# Patient Record
Sex: Female | Born: 1935 | Race: White | Hispanic: No | Smoking: Never smoker
Health system: Southern US, Community
[De-identification: ages and names within clinical notes are randomized; demographics above are authoritative.]

## PROBLEM LIST (undated history)

## (undated) DIAGNOSIS — E079 Disorder of thyroid, unspecified: Secondary | ICD-10-CM

## (undated) DIAGNOSIS — I10 Essential (primary) hypertension: Secondary | ICD-10-CM

## (undated) DIAGNOSIS — I639 Cerebral infarction, unspecified: Secondary | ICD-10-CM

## (undated) DIAGNOSIS — E785 Hyperlipidemia, unspecified: Secondary | ICD-10-CM

## (undated) HISTORY — DX: Essential (primary) hypertension: I10

## (undated) HISTORY — DX: Hyperlipidemia, unspecified: E78.5

## (undated) HISTORY — PX: JOINT REPLACEMENT: SHX530

---

## 2013-08-20 ENCOUNTER — Ambulatory Visit: Payer: Medicare Other | Admitting: Family Medicine

## 2013-08-25 ENCOUNTER — Ambulatory Visit (INDEPENDENT_AMBULATORY_CARE_PROVIDER_SITE_OTHER): Payer: Medicare Other | Admitting: Family Medicine

## 2013-08-25 ENCOUNTER — Encounter: Payer: Self-pay | Admitting: Family Medicine

## 2013-08-25 ENCOUNTER — Ambulatory Visit: Payer: Self-pay | Admitting: Family Medicine

## 2013-08-25 VITALS — BP 195/91 | HR 62 | Ht 65.0 in | Wt 204.6 lb

## 2013-08-25 DIAGNOSIS — M5412 Radiculopathy, cervical region: Secondary | ICD-10-CM

## 2013-08-25 DIAGNOSIS — M5010 Cervical disc disorder with radiculopathy, unspecified cervical region: Secondary | ICD-10-CM

## 2013-08-25 NOTE — Patient Instructions (Signed)
You have cervical radiculopathy (an irritated nerve in the neck). Start physical therapy for stretching, exercises, traction, and modalities. Heat 15 minutes at a time 3-4 times a day to help with spasms. Watch head position when on computers, texting, when sleeping in bed - should in line with back to prevent further nerve traction and irritation. Consider prednisone 6 day dose pack to relieve irritation/inflammation of the nerve. Consider cervical collar if severely painful. Simple range of motion exercises within limits of pain to prevent further stiffness. Consider home traction unit if you get benefit with this in physical therapy. If not improving we will consider an MRI. Follow up with me in 5-6 weeks.

## 2013-08-27 ENCOUNTER — Encounter: Payer: Self-pay | Admitting: Family Medicine

## 2013-08-27 DIAGNOSIS — M5010 Cervical disc disorder with radiculopathy, unspecified cervical region: Secondary | ICD-10-CM | POA: Insufficient documentation

## 2013-08-27 NOTE — Assessment & Plan Note (Signed)
discussed options.  Patient would like to start with physical therapy.  Heat, ergonomic issues discussed.  Consider prednisone, cervical collar, imaging if not improving. F/u in 5-6 weeks.

## 2013-08-27 NOTE — Progress Notes (Signed)
Patient ID: Teresa Ward, female   DOB: 1930/09/22, 80 y.o.   MRN: [id number]  PCP: No primary provider on file.  Subjective:   HPI: Patient is a 80 y.o. female here for left arm numbness.  Patient reports for several months she has had left shoulder soreness initially that improved. Then became sore in left upper arm to neck. Associated with numbness down arm into all fingers and thumb. Has not tried anything for this. Nothing similar in the past. No bowel/bladder dysfunction.  Past Medical History  Diagnosis Date  . Hyperlipidemia   . Hypertension     No current outpatient prescriptions on file prior to visit.   No current facility-administered medications on file prior to visit.    History reviewed. No pertinent past surgical history.  No Known Allergies  History   Social History  . Marital Status: Married    Spouse Name: N/A    Number of Children: N/A  . Years of Education: N/A   Occupational History  . Not on file.   Social History Main Topics  . Smoking status: Never Smoker   . Smokeless tobacco: Not on file  . Alcohol Use: Not on file  . Drug Use: Not on file  . Sexual Activity: Not on file   Other Topics Concern  . Not on file   Social History Narrative  . No narrative on file    Family History  Problem Relation Age of Onset  . Diabetes Father     BP 195/91  Pulse 62  Ht 5\' 5"  (1.651 m)  Wt 204 lb 9.6 oz (92.806 kg)  BMI 34.05 kg/m2  Review of Systems: See HPI above.    Objective:  Physical Exam:  Gen: NAD  Neck: No gross deformity, swelling, bruising. TTP left cervical paraspinal region.  No midline/bony TTP. FROM neck - pain with full extension, flexion, left lat rotation mildly. BUE strength 5/5.   Sensation intact to light touch currently 2+ equal reflexes in triceps, biceps, brachioradialis tendons. Negative spurlings. NV intact distal BUEs.  L shoulder: No swelling, ecchymoses.  No gross deformity. No  TTP. FROM. Negative Hawkins, Neers. Strength 5/5 with empty can and resisted internal/external rotation. Negative apprehension. NV intact distally.    Assessment & Plan:  1. Cervical radiculopathy - discussed options.  Patient would like to start with physical therapy.  Heat, ergonomic issues discussed.  Consider prednisone, cervical collar, imaging if not improving. F/u in 5-6 weeks.

## 2013-09-01 ENCOUNTER — Ambulatory Visit: Payer: Medicare Other | Attending: Family Medicine | Admitting: Rehabilitation

## 2013-09-01 DIAGNOSIS — M5412 Radiculopathy, cervical region: Secondary | ICD-10-CM | POA: Insufficient documentation

## 2013-09-01 DIAGNOSIS — IMO0001 Reserved for inherently not codable concepts without codable children: Secondary | ICD-10-CM | POA: Insufficient documentation

## 2013-09-03 ENCOUNTER — Encounter: Payer: Medicare Other | Admitting: Rehabilitation

## 2013-09-08 ENCOUNTER — Ambulatory Visit: Payer: Medicare Other | Attending: Family Medicine | Admitting: Rehabilitation

## 2013-09-08 DIAGNOSIS — M5412 Radiculopathy, cervical region: Secondary | ICD-10-CM | POA: Insufficient documentation

## 2013-09-08 DIAGNOSIS — IMO0001 Reserved for inherently not codable concepts without codable children: Secondary | ICD-10-CM | POA: Insufficient documentation

## 2013-09-10 ENCOUNTER — Ambulatory Visit: Payer: Medicare Other | Admitting: Rehabilitation

## 2013-09-15 ENCOUNTER — Ambulatory Visit: Payer: Medicare Other | Admitting: Rehabilitation

## 2013-09-17 ENCOUNTER — Ambulatory Visit: Payer: Medicare Other | Admitting: Rehabilitation

## 2013-09-22 ENCOUNTER — Ambulatory Visit: Payer: Medicare Other | Admitting: Rehabilitation

## 2013-09-24 ENCOUNTER — Ambulatory Visit: Payer: Medicare Other | Admitting: Rehabilitation

## 2013-10-06 ENCOUNTER — Ambulatory Visit (INDEPENDENT_AMBULATORY_CARE_PROVIDER_SITE_OTHER): Payer: Medicare Other | Admitting: Family Medicine

## 2013-10-06 ENCOUNTER — Encounter: Payer: Self-pay | Admitting: Family Medicine

## 2013-10-06 VITALS — BP 178/107 | HR 65 | Ht 65.0 in | Wt 203.6 lb

## 2013-10-06 DIAGNOSIS — M5010 Cervical disc disorder with radiculopathy, unspecified cervical region: Secondary | ICD-10-CM

## 2013-10-06 DIAGNOSIS — M5412 Radiculopathy, cervical region: Secondary | ICD-10-CM

## 2013-10-06 NOTE — Progress Notes (Signed)
Patient ID: Teresa Ward, female   DOB: August 05, 1930, 80 y.o.   MRN: [id number]  PCP: No primary provider on file.  Subjective:   HPI: Patient is a 80 y.o. female here for left arm numbness.  2/16: Patient reports for several months she has had left shoulder soreness initially that improved. Then became sore in left upper arm to neck. Associated with numbness down arm into all fingers and thumb. Has not tried anything for this. Nothing similar in the past. No bowel/bladder dysfunction.  3/30: Patient reports she feels about the same as last visit but then notes no longer with shoulder, neck soreness. Does have numbness in left hand thumb through middle finger. Did physical therapy but stopped because didn't notice benefit with this. Not taking any medications at this point. No bowel/bladder dysfunction.  Past Medical History  Diagnosis Date  . Hyperlipidemia   . Hypertension     No current outpatient prescriptions on file prior to visit.   No current facility-administered medications on file prior to visit.    History reviewed. No pertinent past surgical history.  No Known Allergies  History   Social History  . Marital Status: Married    Spouse Name: N/A    Number of Children: N/A  . Years of Education: N/A   Occupational History  . Not on file.   Social History Main Topics  . Smoking status: Never Smoker   . Smokeless tobacco: Not on file  . Alcohol Use: Not on file  . Drug Use: Not on file  . Sexual Activity: Not on file   Other Topics Concern  . Not on file   Social History Narrative  . No narrative on file    Family History  Problem Relation Age of Onset  . Diabetes Father     BP 178/107  Pulse 65  Ht 5\' 5"  (1.651 m)  Wt 203 lb 9.6 oz (92.352 kg)  BMI 33.88 kg/m2  Review of Systems: See HPI above.    Objective:  Physical Exam:  Gen: NAD  Neck: No gross deformity, swelling, bruising. No longer with TTP left cervical paraspinal  region.  No midline/bony TTP. FROM neck without pain. BUE strength 5/5.   Sensation intact to light touch currently 2+ equal reflexes in triceps, biceps, brachioradialis tendons. Negative spurlings. NV intact distal BUEs. Negative tinels, phalens of carpal tunnel.    Assessment & Plan:  1. Cervical radiculopathy - has improved from last visit but still left with numbness.  This is also in carpal tunnel distribution though that testing is negative.  She would like to try physical therapy location that her daughter goes to - will write for this.  Try cockup wrist brace at bedtime.  Consider prednisone, cervical collar, imaging, nortriptyline or neurontin if not improving. F/u in 6 weeks.

## 2013-10-06 NOTE — Patient Instructions (Signed)
You have cervical radiculopathy (an irritated nerve in the neck). It's possible you have carpal tunnel syndrome though testing is negative for this - treatment is simple - use a cockup wrist brace when you sleep for 6 weeks. We will try the physical therapy place you mentioned. Heat 15 minutes at a time 3-4 times a day to help with spasms. Watch head position when on computers, texting, when sleeping in bed - should in line with back to prevent further nerve traction and irritation. Consider prednisone, cervical collar, other nerve blocking medicines if not improving. Follow up in 6 weeks.

## 2013-10-06 NOTE — Assessment & Plan Note (Signed)
has improved from last visit but still left with numbness.  This is also in carpal tunnel distribution though that testing is negative.  She would like to try physical therapy location that her daughter goes to - will write for this.  Try cockup wrist brace at bedtime.  Consider prednisone, cervical collar, imaging, nortriptyline or neurontin if not improving. F/u in 6 weeks.

## 2013-11-17 ENCOUNTER — Ambulatory Visit: Payer: Medicare Other | Admitting: Family Medicine

## 2015-09-02 DIAGNOSIS — I10 Essential (primary) hypertension: Secondary | ICD-10-CM | POA: Insufficient documentation

## 2015-09-02 DIAGNOSIS — E039 Hypothyroidism, unspecified: Secondary | ICD-10-CM | POA: Insufficient documentation

## 2017-02-14 DIAGNOSIS — I639 Cerebral infarction, unspecified: Secondary | ICD-10-CM | POA: Insufficient documentation

## 2017-02-22 DIAGNOSIS — R55 Syncope and collapse: Secondary | ICD-10-CM | POA: Insufficient documentation

## 2017-02-22 DIAGNOSIS — Z789 Other specified health status: Secondary | ICD-10-CM | POA: Insufficient documentation

## 2017-02-22 DIAGNOSIS — Z7409 Other reduced mobility: Secondary | ICD-10-CM | POA: Insufficient documentation

## 2017-03-05 DIAGNOSIS — R1312 Dysphagia, oropharyngeal phase: Secondary | ICD-10-CM | POA: Insufficient documentation

## 2017-03-05 DIAGNOSIS — E119 Type 2 diabetes mellitus without complications: Secondary | ICD-10-CM | POA: Insufficient documentation

## 2017-03-05 DIAGNOSIS — D638 Anemia in other chronic diseases classified elsewhere: Secondary | ICD-10-CM | POA: Insufficient documentation

## 2017-03-05 DIAGNOSIS — I6990 Unspecified sequelae of unspecified cerebrovascular disease: Secondary | ICD-10-CM | POA: Insufficient documentation

## 2018-01-01 ENCOUNTER — Ambulatory Visit (HOSPITAL_BASED_OUTPATIENT_CLINIC_OR_DEPARTMENT_OTHER)
Admission: RE | Admit: 2018-01-01 | Discharge: 2018-01-01 | Disposition: A | Discharge status: Home / Self Care | Payer: Medicare HMO | Source: Ambulatory Visit | Attending: Family Medicine | Admitting: Family Medicine

## 2018-01-01 ENCOUNTER — Encounter: Payer: Self-pay | Admitting: Family Medicine

## 2018-01-01 ENCOUNTER — Ambulatory Visit: Payer: Medicare HMO | Admitting: Family Medicine

## 2018-01-01 VITALS — BP 121/79 | HR 66 | Ht 65.0 in | Wt 158.0 lb

## 2018-01-01 DIAGNOSIS — S52592A Other fractures of lower end of left radius, initial encounter for closed fracture: Secondary | ICD-10-CM | POA: Insufficient documentation

## 2018-01-01 DIAGNOSIS — M7989 Other specified soft tissue disorders: Secondary | ICD-10-CM | POA: Diagnosis present

## 2018-01-01 DIAGNOSIS — S6992XA Unspecified injury of left wrist, hand and finger(s), initial encounter: Secondary | ICD-10-CM

## 2018-01-01 NOTE — Patient Instructions (Signed)
You have a distal radius fracture that has some healing already suggesting this is about 183 weeks old. Wear the wrist brace at all times except to wash the area, ice this. Ice 15 minutes at a time as needed. Ok to take tylenol 500mg  1-2 tabs three times a day as needed for pain. Elevate above your heart to help with the swelling. Follow up with me in 2.5-3 weeks to reevaluate, potentially repeat x-rays.

## 2018-01-02 ENCOUNTER — Encounter: Payer: Self-pay | Admitting: Family Medicine

## 2018-01-02 DIAGNOSIS — S6992XD Unspecified injury of left wrist, hand and finger(s), subsequent encounter: Secondary | ICD-10-CM | POA: Insufficient documentation

## 2018-01-02 NOTE — Assessment & Plan Note (Signed)
independently reviewed radiographs and noted mildly dorsally angulated and impacted distal radius fracture with healing already present.  This suggests that the fracture has been sustained more remotely than 10 days ago as patient thought.  We discussed given that this is remote and already with some interval healing the best course of action is to complete conservative treatment for this.  She was advised that she is likely to have decreased motion at the wrist especially with extension but her pain should resolve and she should regain her strength.  Balance is an issue for her as well.  We discussed all options of immobilization and opted for a cock up wrist brace to wear at all times except to wash and ice the area.  She will take Tylenol as needed for pain, elevate above her heart to help with the swelling.  She will follow-up in 2-1/2 to 3 weeks to reevaluate and possibly repeat her x-rays.  She may need occupational therapy when she is completed treatment.

## 2018-01-02 NOTE — Progress Notes (Signed)
PCP: System, Pcp Not In  Subjective:   HPI: Patient is a 84 y.o. female here for left wrist injury.  Patient is here with her daughter. They report that approximately 1-1/2 weeks ago she lost her balance at home and fell sustaining a FOOSH injury to her left wrist. Since then she is had 7 out of 10 level of pain on the dorsal aspect of her left hand and wrist. No prior injuries to this. Associated swelling and difficulty holding gripping items including a coffee cup. Some mild bruising but no other skin changes. No numbness.  Past Medical History:  Diagnosis Date  . Hyperlipidemia   . Hypertension     Current Outpatient Medications on File Prior to Visit  Medication Sig Dispense Refill  . aspirin 325 MG tablet Take by mouth.    Marland Kitchen atorvastatin (LIPITOR) 80 MG tablet Take 80 mg by mouth daily.  2  . DULoxetine (CYMBALTA) 60 MG capsule Take 60 mg by mouth at bedtime.  4  . levothyroxine (SYNTHROID, LEVOTHROID) 75 MCG tablet Take 75 mcg by mouth every morning.  3  . oxybutynin (DITROPAN-XL) 5 MG 24 hr tablet     . valsartan-hydrochlorothiazide (DIOVAN-HCT) 320-12.5 MG tablet Take 1 tablet by mouth every morning.  3   No current facility-administered medications on file prior to visit.     History reviewed. No pertinent surgical history.  No Known Allergies  Social History   Socioeconomic History  . Marital status: Married    Spouse name: Not on file  . Number of children: Not on file  . Years of education: Not on file  . Highest education level: Not on file  Occupational History  . Not on file  Social Needs  . Financial resource strain: Not on file  . Food insecurity:    Worry: Not on file    Inability: Not on file  . Transportation needs:    Medical: Not on file    Non-medical: Not on file  Tobacco Use  . Smoking status: Never Smoker  . Smokeless tobacco: Never Used  Substance and Sexual Activity  . Alcohol use: Not on file  . Drug use: Not on file  . Sexual  activity: Not on file  Lifestyle  . Physical activity:    Days per week: Not on file    Minutes per session: Not on file  . Stress: Not on file  Relationships  . Social connections:    Talks on phone: Not on file    Gets together: Not on file    Attends religious service: Not on file    Active member of club or organization: Not on file    Attends meetings of clubs or organizations: Not on file    Relationship status: Not on file  . Intimate partner violence:    Fear of current or ex partner: Not on file    Emotionally abused: Not on file    Physically abused: Not on file    Forced sexual activity: Not on file  Other Topics Concern  . Not on file  Social History Narrative  . Not on file    Family History  Problem Relation Age of Onset  . Diabetes Father     BP 121/79   Pulse 66   Ht 5\' 5"  (1.651 m)   Wt 158 lb (71.7 kg)   BMI 26.29 kg/m   Review of Systems: See HPI above.     Objective:  Physical Exam:  Gen: NAD,  comfortable in exam room  Left wrist: Swelling over radial aspect of hand and wrist.  Slight bruising in this area.  No malrotation or angulation of digits or distal forearm. There is tenderness to palpation over lunate, distal radius and snuffbox.  No other tenderness including elbow. Full range of motion digits with 5 out of 5 strength with finger extension, abduction, thumb opposition.  Only has about 5 degrees of extension and 10 degrees of flexion. Sensation intact light touch with 2+ radial pulses.  Right wrist: No deformity. FROM with 5/5 strength. No tenderness to palpation. NVI distally.   Assessment & Plan:  1. Left wrist injury -independently reviewed radiographs and noted mildly dorsally angulated and impacted distal radius fracture with healing already present.  This suggests that the fracture has been sustained more remotely than 10 days ago as patient thought.  We discussed given that this is remote and already with some interval  healing the best course of action is to complete conservative treatment for this.  She was advised that she is likely to have decreased motion at the wrist especially with extension but her pain should resolve and she should regain her strength.  Balance is an issue for her as well.  We discussed all options of immobilization and opted for a cock up wrist brace to wear at all times except to wash and ice the area.  She will take Tylenol as needed for pain, elevate above her heart to help with the swelling.  She will follow-up in 2-1/2 to 3 weeks to reevaluate and possibly repeat her x-rays.  She may need occupational therapy when she is completed treatment.

## 2018-01-07 DIAGNOSIS — G8929 Other chronic pain: Secondary | ICD-10-CM | POA: Insufficient documentation

## 2018-01-07 DIAGNOSIS — F419 Anxiety disorder, unspecified: Secondary | ICD-10-CM | POA: Insufficient documentation

## 2018-01-07 DIAGNOSIS — N3281 Overactive bladder: Secondary | ICD-10-CM | POA: Insufficient documentation

## 2018-01-24 ENCOUNTER — Ambulatory Visit: Payer: Medicare HMO | Admitting: Family Medicine

## 2018-01-24 ENCOUNTER — Encounter: Payer: Self-pay | Admitting: Family Medicine

## 2018-01-24 ENCOUNTER — Ambulatory Visit (HOSPITAL_BASED_OUTPATIENT_CLINIC_OR_DEPARTMENT_OTHER)
Admission: RE | Admit: 2018-01-24 | Discharge: 2018-01-24 | Disposition: A | Discharge status: Home / Self Care | Payer: Medicare HMO | Source: Ambulatory Visit | Attending: Family Medicine | Admitting: Family Medicine

## 2018-01-24 VITALS — BP 154/82 | HR 61 | Ht 66.0 in | Wt 176.0 lb

## 2018-01-24 DIAGNOSIS — X58XXXD Exposure to other specified factors, subsequent encounter: Secondary | ICD-10-CM | POA: Diagnosis not present

## 2018-01-24 DIAGNOSIS — S52502D Unspecified fracture of the lower end of left radius, subsequent encounter for closed fracture with routine healing: Secondary | ICD-10-CM | POA: Diagnosis present

## 2018-01-24 DIAGNOSIS — S59202D Unspecified physeal fracture of lower end of radius, left arm, subsequent encounter for fracture with routine healing: Secondary | ICD-10-CM | POA: Diagnosis not present

## 2018-01-24 DIAGNOSIS — S6992XD Unspecified injury of left wrist, hand and finger(s), subsequent encounter: Secondary | ICD-10-CM

## 2018-01-24 NOTE — Patient Instructions (Signed)
Your x-rays look good but this is still not healed enough to stop the brace. Continue the brace for 2 more weeks and follow up with me at that time.

## 2018-01-25 ENCOUNTER — Encounter: Payer: Self-pay | Admitting: Family Medicine

## 2018-01-25 NOTE — Progress Notes (Signed)
PCP: System, Pcp Not In  Subjective:   HPI: Patient is a 84 y.o. female here for left wrist injury.  6/25: Patient is here with her daughter. They report that approximately 1-1/2 weeks ago she lost her balance at home and fell sustaining a FOOSH injury to her left wrist. Since then she is had 7 out of 10 level of pain on the dorsal aspect of her left hand and wrist. No prior injuries to this. Associated swelling and difficulty holding gripping items including a coffee cup. Some mild bruising but no other skin changes. No numbness.  7/18: Patient returns stating she's doing well. Tolerating wrist brace. Pain with movement if not wearing this. Pain currently 0/10, occasionally taking tylenol. No skin changes.  Past Medical History:  Diagnosis Date  . Hyperlipidemia   . Hypertension     Current Outpatient Medications on File Prior to Visit  Medication Sig Dispense Refill  . mirabegron ER (MYRBETRIQ) 50 MG TB24 tablet Take by mouth.    Marland Kitchen aspirin 325 MG tablet Take by mouth.    Marland Kitchen atorvastatin (LIPITOR) 80 MG tablet Take 80 mg by mouth daily.  2  . DULoxetine (CYMBALTA) 60 MG capsule Take 60 mg by mouth at bedtime.  4  . levothyroxine (SYNTHROID, LEVOTHROID) 100 MCG tablet Take 100 mcg by mouth daily.  0  . oxybutynin (DITROPAN-XL) 5 MG 24 hr tablet     . valsartan-hydrochlorothiazide (DIOVAN-HCT) 320-12.5 MG tablet Take 1 tablet by mouth every morning.  3   No current facility-administered medications on file prior to visit.     History reviewed. No pertinent surgical history.  No Known Allergies  Social History   Socioeconomic History  . Marital status: Married    Spouse name: Not on file  . Number of children: Not on file  . Years of education: Not on file  . Highest education level: Not on file  Occupational History  . Not on file  Social Needs  . Financial resource strain: Not on file  . Food insecurity:    Worry: Not on file    Inability: Not on file  .  Transportation needs:    Medical: Not on file    Non-medical: Not on file  Tobacco Use  . Smoking status: Never Smoker  . Smokeless tobacco: Never Used  Substance and Sexual Activity  . Alcohol use: Not on file  . Drug use: Not on file  . Sexual activity: Not on file  Lifestyle  . Physical activity:    Days per week: Not on file    Minutes per session: Not on file  . Stress: Not on file  Relationships  . Social connections:    Talks on phone: Not on file    Gets together: Not on file    Attends religious service: Not on file    Active member of club or organization: Not on file    Attends meetings of clubs or organizations: Not on file    Relationship status: Not on file  . Intimate partner violence:    Fear of current or ex partner: Not on file    Emotionally abused: Not on file    Physically abused: Not on file    Forced sexual activity: Not on file  Other Topics Concern  . Not on file  Social History Narrative  . Not on file    Family History  Problem Relation Age of Onset  . Diabetes Father     BP (!) 154/82  Pulse 61   Ht 5\' 6"  (1.676 m)   Wt 176 lb (79.8 kg)   BMI 28.41 kg/m   Review of Systems: See HPI above.     Objective:  Physical Exam:  Gen: NAD, comfortable in exam room  Left wrist: Mild swelling distal wrist.  No bruising, malrotation or angulation. Minimal TTP distal radius.  No other tenderness. FROM digits with 5/5 strength finger extension, abduction, thumb opposition. Only 5 degrees extension, 10 degrees flexion of wrist. NVI distally.   Assessment & Plan:  1. Left wrist injury - independently reviewed repeat radiographs and healing noted but fracture line still visible of impacted distal radius fracture.  Advised we continue with wrist brace for 2 more weeks then reevaluate.  Tylenol if needed.

## 2018-01-25 NOTE — Assessment & Plan Note (Signed)
independently reviewed repeat radiographs and healing noted but fracture line still visible of impacted distal radius fracture.  Advised we continue with wrist brace for 2 more weeks then reevaluate.  Tylenol if needed.

## 2018-02-07 ENCOUNTER — Ambulatory Visit: Payer: Self-pay | Admitting: Family Medicine

## 2018-02-10 MED ORDER — HYDROCHLOROTHIAZIDE 25 MG PO TABS
25.00 | ORAL_TABLET | ORAL | Status: DC
Start: 2018-02-11 — End: 2018-02-10

## 2018-02-10 MED ORDER — DULOXETINE HCL 30 MG PO CPEP
60.00 | ORAL_CAPSULE | ORAL | Status: DC
Start: 2018-02-10 — End: 2018-02-10

## 2018-02-10 MED ORDER — LEVOTHYROXINE SODIUM 100 MCG PO TABS
100.00 | ORAL_TABLET | ORAL | Status: DC
Start: 2018-02-11 — End: 2018-02-10

## 2018-02-10 MED ORDER — LOSARTAN POTASSIUM 50 MG PO TABS
100.00 | ORAL_TABLET | ORAL | Status: DC
Start: 2018-02-11 — End: 2018-02-10

## 2018-02-10 MED ORDER — MIRABEGRON ER 50 MG PO TB24
50.00 | ORAL_TABLET | ORAL | Status: DC
Start: 2018-02-10 — End: 2018-02-10

## 2018-02-10 MED ORDER — ONDANSETRON HCL 4 MG/2ML IJ SOLN
4.00 | INTRAMUSCULAR | Status: DC
Start: ? — End: 2018-02-10

## 2018-02-10 MED ORDER — ATORVASTATIN CALCIUM 40 MG PO TABS
80.00 | ORAL_TABLET | ORAL | Status: DC
Start: 2018-02-10 — End: 2018-02-10

## 2018-02-10 MED ORDER — ACETAMINOPHEN 325 MG PO TABS
650.00 | ORAL_TABLET | ORAL | Status: DC
Start: ? — End: 2018-02-10

## 2018-02-14 ENCOUNTER — Ambulatory Visit: Payer: Medicare HMO | Admitting: Family Medicine

## 2018-02-14 ENCOUNTER — Encounter: Payer: Self-pay | Admitting: Family Medicine

## 2018-02-14 VITALS — BP 122/79 | HR 85 | Ht 64.0 in | Wt 178.0 lb

## 2018-02-14 DIAGNOSIS — S6992XD Unspecified injury of left wrist, hand and finger(s), subsequent encounter: Secondary | ICD-10-CM | POA: Diagnosis not present

## 2018-02-14 NOTE — Progress Notes (Signed)
PCP: System, Pcp Not In  Subjective:   HPI: Patient is a 84 y.o. female here for left wrist injury.  6/25: Patient is here with her daughter. They report that approximately 1-1/2 weeks ago she lost her balance at home and fell sustaining a FOOSH injury to her left wrist. Since then she is had 7 out of 10 level of pain on the dorsal aspect of her left hand and wrist. No prior injuries to this. Associated swelling and difficulty holding gripping items including a coffee cup. Some mild bruising but no other skin changes. No numbness.  7/18: Patient returns stating she's doing well. Tolerating wrist brace. Pain with movement if not wearing this. Pain currently 0/10, occasionally taking tylenol. No skin changes.  8/8: Patient reports her wrist feels about the same. Pain level 0/10 but up to 9/10 and sharp with motion. Wearing wrist brace regularly. No skin changes. She unfortunately had a fall last Thursday and was hospitalized with a subdural hematoma, spinous process fracture. She is being seen in follow-up for these by other physicians, wearing cervical collar.  Past Medical History:  Diagnosis Date  . Hyperlipidemia   . Hypertension     Current Outpatient Medications on File Prior to Visit  Medication Sig Dispense Refill  . aspirin 325 MG tablet Take by mouth.    Marland Kitchen atorvastatin (LIPITOR) 80 MG tablet Take 80 mg by mouth daily.  2  . DULoxetine (CYMBALTA) 60 MG capsule Take 60 mg by mouth at bedtime.  4  . levothyroxine (SYNTHROID, LEVOTHROID) 100 MCG tablet Take 100 mcg by mouth daily.  0  . levothyroxine (SYNTHROID, LEVOTHROID) 75 MCG tablet     . mirabegron ER (MYRBETRIQ) 50 MG TB24 tablet Take by mouth.    . oxybutynin (DITROPAN-XL) 5 MG 24 hr tablet     . valsartan-hydrochlorothiazide (DIOVAN-HCT) 320-12.5 MG tablet Take 1 tablet by mouth every morning.  3   No current facility-administered medications on file prior to visit.     History reviewed. No pertinent  surgical history.  No Known Allergies  Social History   Socioeconomic History  . Marital status: Married    Spouse name: Not on file  . Number of children: Not on file  . Years of education: Not on file  . Highest education level: Not on file  Occupational History  . Not on file  Social Needs  . Financial resource strain: Not on file  . Food insecurity:    Worry: Not on file    Inability: Not on file  . Transportation needs:    Medical: Not on file    Non-medical: Not on file  Tobacco Use  . Smoking status: Never Smoker  . Smokeless tobacco: Never Used  Substance and Sexual Activity  . Alcohol use: Not on file  . Drug use: Not on file  . Sexual activity: Not on file  Lifestyle  . Physical activity:    Days per week: Not on file    Minutes per session: Not on file  . Stress: Not on file  Relationships  . Social connections:    Talks on phone: Not on file    Gets together: Not on file    Attends religious service: Not on file    Active member of club or organization: Not on file    Attends meetings of clubs or organizations: Not on file    Relationship status: Not on file  . Intimate partner violence:    Fear of current or  ex partner: Not on file    Emotionally abused: Not on file    Physically abused: Not on file    Forced sexual activity: Not on file  Other Topics Concern  . Not on file  Social History Narrative  . Not on file    Family History  Problem Relation Age of Onset  . Diabetes Father     BP 122/79   Pulse 85   Ht 5\' 4"  (1.626 m)   Wt 178 lb (80.7 kg)   BMI 30.55 kg/m   Review of Systems: See HPI above.     Objective:  Physical Exam:  Gen: NAD, comfortable in exam room  Left wrist: Mild swelling circumferentially.  No bruising, malrotation or angulation. FROM digits with 5/5 strength.  Minimal motion of wrist - 5 degrees extension, 10 degrees flexion. No tenderness to palpation of distal radius or ulna. NVI distally.  MSK u/s  left wrist:  Near complete healing of distal radius fracture - small amount of fluid with neovascularity with small fracture line evident dorsally but good callus elsewhere.   Assessment & Plan:  1. Left wrist injury - ultrasound reassuring.  Last set of radiographs noted healing impacted distal radius fracture as well.  No tenderness at fracture site.  Pain consistent with stiffness from her immobility since being in brace - advised to wean out of this.  Icing, tylenol, topical medications if needed.  Call us if struggling and we can put in an order form home OT.  Daughter also asked about patient's right knee which intermittently hurts anteriorly - noted moderate spurring medially on ultrasound.  Reviewed arthritis instructions.

## 2018-02-14 NOTE — Patient Instructions (Signed)
Transition from using the brace now (it's ok if you need this for a little bit but I don't want you relying on this). Your pain is primarily from the stiffness having had to use the brace for so long. Icing, tylenol, topical capsaicin/biofreeze/aspercreme if needed. If you're struggling I can order  Occupational therapy to come to the house.  Your knee pain is due to arthritis. These are the different medications you can take for this: Tylenol 500mg  1-2 tabs three times a day for pain. Capsaicin, aspercreme, or biofreeze topically up to four times a day may also help with pain. Some supplements that may help for arthritis: Boswellia extract, curcumin, pycnogenol Aleve 1-2 tabs twice a day with food rarely as this can irritate your stomach, increase your blood pressure. Cortisone injections are an option. If cortisone injections do not help, there are different types of shots that may help but they take longer to take effect. It's important that you continue to stay active. Straight leg raises, knee extensions 3 sets of 10 once a day (add ankle weight if these become too easy). Consider physical therapy to strengthen muscles around the joint that hurts to take pressure off of the joint itself. Shoe inserts with good arch support may be helpful. Heat or ice 15 minutes at a time 3-4 times a day as needed to help with pain. Follow up with me as needed if you're doing well.

## 2018-11-06 ENCOUNTER — Other Ambulatory Visit: Payer: Self-pay

## 2018-11-06 ENCOUNTER — Encounter: Payer: Self-pay | Admitting: Family Medicine

## 2018-11-06 ENCOUNTER — Ambulatory Visit: Payer: Medicare HMO | Admitting: Family Medicine

## 2018-11-06 ENCOUNTER — Ambulatory Visit (HOSPITAL_BASED_OUTPATIENT_CLINIC_OR_DEPARTMENT_OTHER)
Admission: RE | Admit: 2018-11-06 | Discharge: 2018-11-06 | Disposition: A | Discharge status: Home / Self Care | Payer: Medicare HMO | Source: Ambulatory Visit | Attending: Family Medicine | Admitting: Family Medicine

## 2018-11-06 VITALS — BP 142/84 | HR 68 | Ht 65.0 in | Wt 182.0 lb

## 2018-11-06 DIAGNOSIS — S6991XA Unspecified injury of right wrist, hand and finger(s), initial encounter: Secondary | ICD-10-CM

## 2018-11-06 NOTE — Progress Notes (Signed)
PCP: System, Pcp Not In  Subjective:   HPI: Patient is a 85 y.o. female here for right wrist injury.  Patient presents with right wrist pain secondary to a fall on 10/30/2018.  Patient is accompanied by her daughter.  She states that the fall resulted due to the patient attempting to ambulate without assistance.  She was evaluated at an urgent care 3 days after the fall.  X-rays were done at that time.  They did bring a CD with x-rays today but unable to be viewed on the computer.  She was discharged with a sugar tong brace and sling. Today she reports 0/10 pain as long as her wrist is immobilized.  Increased pain if she tries to use her hand.  She did have an initial swelling at the wrist but this has subsided.  She continues to have some swelling distally from the MCP into the fingers..  Bruising noted around the wrist.  No other skin changes.  No numbness or tingling hand.  Past Medical History:  Diagnosis Date  . Hyperlipidemia   . Hypertension     Current Outpatient Medications on File Prior to Visit  Medication Sig Dispense Refill  . atorvastatin (LIPITOR) 80 MG tablet Take 80 mg by mouth daily.  2  . DULoxetine (CYMBALTA) 60 MG capsule Take 60 mg by mouth at bedtime.  4  . levothyroxine (SYNTHROID, LEVOTHROID) 100 MCG tablet Take 100 mcg by mouth daily.  0  . levothyroxine (SYNTHROID, LEVOTHROID) 75 MCG tablet     . mirabegron ER (MYRBETRIQ) 50 MG TB24 tablet Take by mouth.    . oxybutynin (DITROPAN-XL) 5 MG 24 hr tablet     . valsartan-hydrochlorothiazide (DIOVAN-HCT) 320-12.5 MG tablet Take 1 tablet by mouth every morning.  3   No current facility-administered medications on file prior to visit.     No past surgical history on file.  No Known Allergies  Social History   Socioeconomic History  . Marital status: Married    Spouse name: Not on file  . Number of children: Not on file  . Years of education: Not on file  . Highest education level: Not on file  Occupational  History  . Not on file  Social Needs  . Financial resource strain: Not on file  . Food insecurity:    Worry: Not on file    Inability: Not on file  . Transportation needs:    Medical: Not on file    Non-medical: Not on file  Tobacco Use  . Smoking status: Never Smoker  . Smokeless tobacco: Never Used  Substance and Sexual Activity  . Alcohol use: Not on file  . Drug use: Not on file  . Sexual activity: Not on file  Lifestyle  . Physical activity:    Days per week: Not on file    Minutes per session: Not on file  . Stress: Not on file  Relationships  . Social connections:    Talks on phone: Not on file    Gets together: Not on file    Attends religious service: Not on file    Active member of club or organization: Not on file    Attends meetings of clubs or organizations: Not on file    Relationship status: Not on file  . Intimate partner violence:    Fear of current or ex partner: Not on file    Emotionally abused: Not on file    Physically abused: Not on file    Forced  sexual activity: Not on file  Other Topics Concern  . Not on file  Social History Narrative  . Not on file    Family History  Problem Relation Age of Onset  . Diabetes Father     BP (!) 142/84   Pulse 68   Ht 5\' 5"  (1.651 m)   Wt 182 lb (82.6 kg)   BMI 30.29 kg/m   Review of Systems: See HPI above.     Objective:  Physical Exam:  VS: reviewed above Gen: awake, alert, NAD, comfortable in exam room Pulm: breathing unlabored  Right wrist/Hand: Inspection: No obvious deformity of the wrist.  Soft tissue swelling from the MCPs distally to the fingers.  Yellowish bruising noted around the wrist and forearm.  No breaks in the skin. Palpation: Tenderness over the ulnar and radial aspect of the wrist ROM: She does have full flexion-extension of the digits.  Wrist motion not tested Strength: Limited due to pain Neurovascular: NV intact.  Good capillary refill  Left wrist/hand: No obvious  deformities Full range of motion Not tenderness. 5/5 strength digits and wrist. N/V intact   Assessment & Plan:  1.  Right wrist pain - xrays obtained today, independently reviewed, show nondisplaced impacted distal radius fracture. No neurovascular compromise - Exos short arm brace - Tylenol as needed for pain - Follow-up in 2-weeks

## 2018-11-06 NOTE — Patient Instructions (Signed)
You have a distal radius fracture. Wear the EXOS brace at all times - blow dry this if it gets wet. Elevate above your heart to help with swelling. Tylenol 500mg  1-2 tabs three times a day as needed for pain. Follow up with me in 2 weeks for reevaluation. You will need to wear this for 5 more weeks.

## 2018-11-09 ENCOUNTER — Encounter: Payer: Self-pay | Admitting: Family Medicine

## 2018-11-20 ENCOUNTER — Encounter: Payer: Self-pay | Admitting: Family Medicine

## 2018-11-20 ENCOUNTER — Ambulatory Visit: Payer: Medicare HMO | Admitting: Family Medicine

## 2018-11-20 ENCOUNTER — Other Ambulatory Visit: Payer: Self-pay

## 2018-11-20 VITALS — BP 130/79 | HR 71 | Ht 66.0 in | Wt 182.0 lb

## 2018-11-20 DIAGNOSIS — S6991XD Unspecified injury of right wrist, hand and finger(s), subsequent encounter: Secondary | ICD-10-CM

## 2018-11-20 NOTE — Progress Notes (Signed)
PCP: System, Pcp Not In  Subjective:   HPI: Patient is a 85 y.o. female here for right wrist injury.   4/29: Patient presents with right wrist pain secondary to a fall on 10/30/2018.  Patient is accompanied by her daughter.  She states that the fall resulted due to the patient attempting to ambulate without assistance.  She was evaluated at an urgent care 3 days after the fall.  X-rays were done at that time.  They did bring a CD with x-rays today but unable to be viewed on the computer.  She was discharged with a sugar tong brace and sling. Today she reports 0/10 pain as long as her wrist is immobilized.  Increased pain if she tries to use her hand.  She did have an initial swelling at the wrist but this has subsided.  She continues to have some swelling distally from the MCP into the fingers..  Bruising noted around the wrist.  No other skin changes.  No numbness or tingling hand.  5/13: Pt returns for 2 week followup for distal right radius fracture that occurred on 10/30/2018. She reports continued 7/10 pain of the wrist with hand/finger movement. 0/10 at rest. She has been taking tylenol with some benefit. Denies any numbness/tingling in the fingers. No erythema. Mild swelling reported. She is accompanied today by her grandson who reports that she has been trying to remove Exos cast but has been unable. No new injury or fall.  Past Medical History:  Diagnosis Date  . Hyperlipidemia   . Hypertension     Current Outpatient Medications on File Prior to Visit  Medication Sig Dispense Refill  . atorvastatin (LIPITOR) 80 MG tablet Take 80 mg by mouth daily.  2  . diclofenac sodium (VOLTAREN) 1 % GEL     . DULoxetine (CYMBALTA) 60 MG capsule Take 60 mg by mouth at bedtime.  4  . levothyroxine (SYNTHROID, LEVOTHROID) 100 MCG tablet Take 100 mcg by mouth daily.  0  . mirabegron ER (MYRBETRIQ) 50 MG TB24 tablet Take by mouth.    . oxybutynin (DITROPAN-XL) 5 MG 24 hr tablet     .  valsartan-hydrochlorothiazide (DIOVAN-HCT) 320-12.5 MG tablet Take 1 tablet by mouth every morning.  3   No current facility-administered medications on file prior to visit.     No past surgical history on file.  No Known Allergies  Social History   Socioeconomic History  . Marital status: Married    Spouse name: Not on file  . Number of children: Not on file  . Years of education: Not on file  . Highest education level: Not on file  Occupational History  . Not on file  Social Needs  . Financial resource strain: Not on file  . Food insecurity:    Worry: Not on file    Inability: Not on file  . Transportation needs:    Medical: Not on file    Non-medical: Not on file  Tobacco Use  . Smoking status: Never Smoker  . Smokeless tobacco: Never Used  Substance and Sexual Activity  . Alcohol use: Not on file  . Drug use: Not on file  . Sexual activity: Not on file  Lifestyle  . Physical activity:    Days per week: Not on file    Minutes per session: Not on file  . Stress: Not on file  Relationships  . Social connections:    Talks on phone: Not on file    Gets together: Not on  file    Attends religious service: Not on file    Active member of club or organization: Not on file    Attends meetings of clubs or organizations: Not on file    Relationship status: Not on file  . Intimate partner violence:    Fear of current or ex partner: Not on file    Emotionally abused: Not on file    Physically abused: Not on file    Forced sexual activity: Not on file  Other Topics Concern  . Not on file  Social History Narrative  . Not on file    Family History  Problem Relation Age of Onset  . Diabetes Father     There were no vitals taken for this visit.  Review of Systems: See HPI above.     Objective:  Physical Exam:  Gen: awake, alert, NAD Pulm: breathing unlabored  Right Wrist/Hand: Inspection: No obvious deformity. Soft tissue swelling diffusely, indentation  from cast/brace visible. No erythema. No ecchymosis. No lacerations/abrasions Palpation: some mild TTP over the dorsal radius over the dorsal compartments ROM: limited ROM of the wrist, normal ROM of the fingers Strength: wrist strength not tested. Normal strength in the fingers. Pain with resisted finger extension Neurovascular: NV intact  MSK US: limited US of the right wrist performed. Fracture visualized, impaction noted with questionable very small early callus formation dorsally - impaction appears unchanged compared to prior x-ray. Only trace swelling at the fracture. There is no obvious tears or tenosynovitis of the extensor tendons.   Assessment & Plan:  1.  Right wrist pain 2/2 nondisplaced distal radius fracture. No further displacement seen today. Likely her pain is related to general stiffness and mild tendonitis. So far healing as expected. - continue in exos brace for another 3 weeks - repeat xray at followup - continue tylenol for pain, elevation

## 2018-11-20 NOTE — Patient Instructions (Signed)
You're doing great. Wear the EXOS brace for 3 more weeks and follow up with me at that time. Elevate above your heart to help with swelling. Tylenol 500mg  1-2 tabs three times a day as needed for pain.

## 2018-12-09 ENCOUNTER — Ambulatory Visit: Payer: Medicare HMO | Admitting: Family Medicine

## 2018-12-09 ENCOUNTER — Ambulatory Visit (HOSPITAL_BASED_OUTPATIENT_CLINIC_OR_DEPARTMENT_OTHER)
Admission: RE | Admit: 2018-12-09 | Discharge: 2018-12-09 | Disposition: A | Discharge status: Home / Self Care | Payer: Medicare HMO | Source: Ambulatory Visit | Attending: Family Medicine | Admitting: Family Medicine

## 2018-12-09 ENCOUNTER — Encounter: Payer: Self-pay | Admitting: Family Medicine

## 2018-12-09 ENCOUNTER — Other Ambulatory Visit: Payer: Self-pay

## 2018-12-09 VITALS — BP 111/69 | HR 85 | Ht 66.0 in | Wt 180.0 lb

## 2018-12-09 DIAGNOSIS — S6991XD Unspecified injury of right wrist, hand and finger(s), subsequent encounter: Secondary | ICD-10-CM

## 2018-12-09 NOTE — Patient Instructions (Signed)
You're doing great! Wear a wrist brace during the day for protection the next 1-2 weeks. Come out of this to do simple motion exercises of the wrist. Tylenol as needed for soreness. Call me if you're struggling and we can put you in occupational therapy. Otherwise follow up with me as needed.

## 2018-12-10 ENCOUNTER — Encounter: Payer: Self-pay | Admitting: Family Medicine

## 2018-12-10 NOTE — Progress Notes (Signed)
PCP: System, Pcp Not In  Subjective:   HPI: Patient is a 85 y.o. female here for right wrist injury.   4/29: Patient presents with right wrist pain secondary to a fall on 10/30/2018.  Patient is accompanied by her daughter.  She states that the fall resulted due to the patient attempting to ambulate without assistance.  She was evaluated at an urgent care 3 days after the fall.  X-rays were done at that time.  They did bring a CD with x-rays today but unable to be viewed on the computer.  She was discharged with a sugar tong brace and sling. Today she reports 0/10 pain as long as her wrist is immobilized.  Increased pain if she tries to use her hand.  She did have an initial swelling at the wrist but this has subsided.  She continues to have some swelling distally from the MCP into the fingers..  Bruising noted around the wrist.  No other skin changes.  No numbness or tingling hand.  5/13: Pt returns for 2 week followup for distal right radius fracture that occurred on 10/30/2018. She reports continued 7/10 pain of the wrist with hand/finger movement. 0/10 at rest. She has been taking tylenol with some benefit. Denies any numbness/tingling in the fingers. No erythema. Mild swelling reported. She is accompanied today by her grandson who reports that she has been trying to remove Exos cast but has been unable. No new injury or fall.  6/1: Patient returns reporting she is doing very well. Pain level is currently a 0 out of 10. She has been wearing the EXOS brace. She had a little bit of soreness this morning but this is resolved. No skin changes or numbness.  Past Medical History:  Diagnosis Date  . Hyperlipidemia   . Hypertension     Current Outpatient Medications on File Prior to Visit  Medication Sig Dispense Refill  . acetaminophen (TYLENOL) 500 MG tablet Take by mouth.    Marland Kitchen. aspirin 325 MG tablet Take by mouth.    Marland Kitchen. atorvastatin (LIPITOR) 80 MG tablet Take 80 mg by mouth daily.  2  .  diclofenac sodium (VOLTAREN) 1 % GEL     . DULoxetine (CYMBALTA) 60 MG capsule Take 60 mg by mouth at bedtime.  4  . levothyroxine (SYNTHROID, LEVOTHROID) 100 MCG tablet Take 100 mcg by mouth daily.  0  . mirabegron ER (MYRBETRIQ) 50 MG TB24 tablet Take by mouth.    . oxybutynin (DITROPAN-XL) 5 MG 24 hr tablet     . valsartan-hydrochlorothiazide (DIOVAN-HCT) 320-12.5 MG tablet Take 1 tablet by mouth every morning.  3   No current facility-administered medications on file prior to visit.     History reviewed. No pertinent surgical history.  No Known Allergies  Social History   Socioeconomic History  . Marital status: Married    Spouse name: Not on file  . Number of children: Not on file  . Years of education: Not on file  . Highest education level: Not on file  Occupational History  . Not on file  Social Needs  . Financial resource strain: Not on file  . Food insecurity:    Worry: Not on file    Inability: Not on file  . Transportation needs:    Medical: Not on file    Non-medical: Not on file  Tobacco Use  . Smoking status: Never Smoker  . Smokeless tobacco: Never Used  Substance and Sexual Activity  . Alcohol use: Not on  file  . Drug use: Not on file  . Sexual activity: Not on file  Lifestyle  . Physical activity:    Days per week: Not on file    Minutes per session: Not on file  . Stress: Not on file  Relationships  . Social connections:    Talks on phone: Not on file    Gets together: Not on file    Attends religious service: Not on file    Active member of club or organization: Not on file    Attends meetings of clubs or organizations: Not on file    Relationship status: Not on file  . Intimate partner violence:    Fear of current or ex partner: Not on file    Emotionally abused: Not on file    Physically abused: Not on file    Forced sexual activity: Not on file  Other Topics Concern  . Not on file  Social History Narrative  . Not on file    Family  History  Problem Relation Age of Onset  . Diabetes Father     BP 111/69   Pulse 85   Ht 5\' 6"  (1.676 m)   Wt 180 lb (81.6 kg)   BMI 29.05 kg/m   Review of Systems: See HPI above.     Objective:  Physical Exam:  Gen: NAD, comfortable in exam room  Right wrist/hand: Mild diffuse swelling.  No bruising or other deformity. No tenderness to palpation throughout wrist or hand. Full range of motion digits with 5 out of 5 strength.  Extension and flexion of the wrist is limited to 15 degrees. Neurovascular intact distally.  Assessment & Plan:  1.  Right wrist pain -secondary to impacted distal radius fracture.  Independently reviewed repeat radiographs today as well as performed a brief musculoskeletal ultrasound showing interval callus formation.  She was switched to a wrist brace to wear during the day for protection the next 1 to 2 weeks.  She will come out of this at least a couple times a day to do simple motion exercises.  Tylenol as needed for soreness.  Call us if she is struggling we will start occupational therapy.  Otherwise follow-up as needed.

## 2018-12-11 ENCOUNTER — Ambulatory Visit: Payer: Medicare HMO | Admitting: Family Medicine

## 2019-10-05 MED ORDER — ACETAMINOPHEN 500 MG PO TABS
1000.00 | ORAL_TABLET | ORAL | Status: DC
Start: ? — End: 2019-10-05

## 2019-10-05 MED ORDER — LEVOTHYROXINE SODIUM 100 MCG PO TABS
100.00 | ORAL_TABLET | ORAL | Status: DC
Start: 2019-10-06 — End: 2019-10-05

## 2019-10-05 MED ORDER — BISACODYL 10 MG RE SUPP
10.00 | RECTAL | Status: DC
Start: ? — End: 2019-10-05

## 2019-10-05 MED ORDER — BACITRACIN ZINC 500 UNIT/GM EX OINT
TOPICAL_OINTMENT | CUTANEOUS | Status: DC
Start: 2019-10-05 — End: 2019-10-05

## 2019-10-05 MED ORDER — POLYETHYLENE GLYCOL 3350 17 GM/SCOOP PO POWD
17.00 | ORAL | Status: DC
Start: 2019-10-06 — End: 2019-10-05

## 2019-10-05 MED ORDER — CYANOCOBALAMIN 500 MCG PO TABS
500.00 | ORAL_TABLET | ORAL | Status: DC
Start: 2019-10-06 — End: 2019-10-05

## 2019-10-05 MED ORDER — DULOXETINE HCL 60 MG PO CPEP
60.00 | ORAL_CAPSULE | ORAL | Status: DC
Start: 2019-10-05 — End: 2019-10-05

## 2019-10-05 MED ORDER — ERGOCALCIFEROL 1.25 MG (50000 UT) PO CAPS
50000.00 | ORAL_CAPSULE | ORAL | Status: DC
Start: 2019-10-09 — End: 2019-10-05

## 2019-10-05 MED ORDER — ATORVASTATIN CALCIUM 40 MG PO TABS
80.00 | ORAL_TABLET | ORAL | Status: DC
Start: 2019-10-05 — End: 2019-10-05

## 2019-10-05 MED ORDER — ENOXAPARIN SODIUM 60 MG/0.6ML ~~LOC~~ SOLN
0.60 | SUBCUTANEOUS | Status: DC
Start: 2019-10-05 — End: 2019-10-05

## 2019-10-05 MED ORDER — SODIUM CHLORIDE FLUSH 0.9 % IV SOLN
10.00 | INTRAVENOUS | Status: DC
Start: ? — End: 2019-10-05

## 2019-10-05 MED ORDER — DOCUSATE SODIUM 283 MG RE ENEM
283.00 | ENEMA | RECTAL | Status: DC
Start: ? — End: 2019-10-05

## 2019-10-05 MED ORDER — SENNOSIDES-DOCUSATE SODIUM 8.6-50 MG PO TABS
2.00 | ORAL_TABLET | ORAL | Status: DC
Start: 2019-10-05 — End: 2019-10-05

## 2019-10-05 MED ORDER — ASPIRIN 325 MG PO TBEC
325.00 | DELAYED_RELEASE_TABLET | ORAL | Status: DC
Start: 2019-10-06 — End: 2019-10-05

## 2020-07-26 ENCOUNTER — Emergency Department (HOSPITAL_BASED_OUTPATIENT_CLINIC_OR_DEPARTMENT_OTHER): Payer: Medicare HMO

## 2020-07-26 ENCOUNTER — Encounter (HOSPITAL_BASED_OUTPATIENT_CLINIC_OR_DEPARTMENT_OTHER): Payer: Self-pay | Admitting: *Deleted

## 2020-07-26 ENCOUNTER — Inpatient Hospital Stay (HOSPITAL_BASED_OUTPATIENT_CLINIC_OR_DEPARTMENT_OTHER)
Admission: EM | Admit: 2020-07-26 | Discharge: 2020-08-03 | DRG: 177 | Disposition: A | Discharge status: Home Health | Payer: Medicare HMO | Attending: Internal Medicine | Admitting: Internal Medicine

## 2020-07-26 ENCOUNTER — Other Ambulatory Visit: Payer: Self-pay

## 2020-07-26 DIAGNOSIS — E039 Hypothyroidism, unspecified: Secondary | ICD-10-CM

## 2020-07-26 DIAGNOSIS — F419 Anxiety disorder, unspecified: Secondary | ICD-10-CM

## 2020-07-26 DIAGNOSIS — U071 COVID-19: Secondary | ICD-10-CM

## 2020-07-26 DIAGNOSIS — Z8673 Personal history of transient ischemic attack (TIA), and cerebral infarction without residual deficits: Secondary | ICD-10-CM

## 2020-07-26 DIAGNOSIS — N3281 Overactive bladder: Secondary | ICD-10-CM

## 2020-07-26 DIAGNOSIS — I10 Essential (primary) hypertension: Secondary | ICD-10-CM

## 2020-07-26 DIAGNOSIS — J1282 Pneumonia due to coronavirus disease 2019: Secondary | ICD-10-CM

## 2020-07-26 DIAGNOSIS — J9601 Acute respiratory failure with hypoxia: Secondary | ICD-10-CM

## 2020-07-26 DIAGNOSIS — E785 Hyperlipidemia, unspecified: Secondary | ICD-10-CM

## 2020-07-26 DIAGNOSIS — D638 Anemia in other chronic diseases classified elsewhere: Secondary | ICD-10-CM

## 2020-07-26 DIAGNOSIS — R1310 Dysphagia, unspecified: Secondary | ICD-10-CM | POA: Diagnosis present

## 2020-07-26 DIAGNOSIS — H532 Diplopia: Secondary | ICD-10-CM | POA: Diagnosis present

## 2020-07-26 DIAGNOSIS — T380X5A Adverse effect of glucocorticoids and synthetic analogues, initial encounter: Secondary | ICD-10-CM | POA: Diagnosis present

## 2020-07-26 DIAGNOSIS — I69398 Other sequelae of cerebral infarction: Secondary | ICD-10-CM

## 2020-07-26 DIAGNOSIS — Z66 Do not resuscitate: Secondary | ICD-10-CM | POA: Diagnosis present

## 2020-07-26 DIAGNOSIS — R64 Cachexia: Secondary | ICD-10-CM | POA: Diagnosis present

## 2020-07-26 DIAGNOSIS — N179 Acute kidney failure, unspecified: Secondary | ICD-10-CM | POA: Diagnosis present

## 2020-07-26 DIAGNOSIS — E876 Hypokalemia: Secondary | ICD-10-CM | POA: Diagnosis present

## 2020-07-26 DIAGNOSIS — Z7982 Long term (current) use of aspirin: Secondary | ICD-10-CM

## 2020-07-26 DIAGNOSIS — Z6821 Body mass index (BMI) 21.0-21.9, adult: Secondary | ICD-10-CM

## 2020-07-26 DIAGNOSIS — D72823 Leukemoid reaction: Secondary | ICD-10-CM | POA: Diagnosis present

## 2020-07-26 HISTORY — DX: Disorder of thyroid, unspecified: E07.9

## 2020-07-26 HISTORY — DX: Cerebral infarction, unspecified: I63.9

## 2020-07-26 HISTORY — DX: Essential (primary) hypertension: I10

## 2020-07-26 LAB — TRIGLYCERIDES: Triglycerides: 64 mg/dL (ref <150)

## 2020-07-26 LAB — CBC WITH DIFFERENTIAL/PLATELET
Abs Immature Granulocytes: 0.05 10*3/uL (ref 0.00–0.07)
Basophils Absolute: 0.1 10*3/uL (ref 0.0–0.1)
Basophils Relative: 1 %
Eosinophils Absolute: 0.2 10*3/uL (ref 0.0–0.5)
Eosinophils Relative: 3 %
HCT: 37 % (ref 36.0–46.0)
Hemoglobin: 11.9 g/dL — ABNORMAL LOW (ref 12.0–15.0)
Immature Granulocytes: 1 %
Lymphocytes Relative: 4 %
Lymphs Abs: 0.3 10*3/uL — ABNORMAL LOW (ref 0.7–4.0)
MCH: 30.1 pg (ref 26.0–34.0)
MCHC: 32.2 g/dL (ref 30.0–36.0)
MCV: 93.7 fL (ref 80.0–100.0)
Monocytes Absolute: 1.1 10*3/uL — ABNORMAL HIGH (ref 0.1–1.0)
Monocytes Relative: 14 %
Neutro Abs: 6.5 10*3/uL (ref 1.7–7.7)
Neutrophils Relative %: 77 %
Platelets: 218 10*3/uL (ref 150–400)
RBC: 3.95 MIL/uL (ref 3.87–5.11)
RDW: 14.9 % (ref 11.5–15.5)
Smear Review: NORMAL
WBC Morphology: INCREASED
WBC: 8.3 10*3/uL (ref 4.0–10.5)
nRBC: 0 % (ref 0.0–0.2)

## 2020-07-26 LAB — COMPREHENSIVE METABOLIC PANEL
ALT: 34 U/L (ref 0–44)
AST: 48 U/L — ABNORMAL HIGH (ref 15–41)
Albumin: 3.3 g/dL — ABNORMAL LOW (ref 3.5–5.0)
Alkaline Phosphatase: 99 U/L (ref 38–126)
Anion gap: 13 (ref 5–15)
BUN: 21 mg/dL (ref 8–23)
CO2: 24 mmol/L (ref 22–32)
Calcium: 8.7 mg/dL — ABNORMAL LOW (ref 8.9–10.3)
Chloride: 95 mmol/L — ABNORMAL LOW (ref 98–111)
Creatinine, Ser: 1.24 mg/dL — ABNORMAL HIGH (ref 0.44–1.00)
GFR, Estimated: 42 mL/min — ABNORMAL LOW (ref >60)
Glucose, Bld: 140 mg/dL — ABNORMAL HIGH (ref 70–99)
Potassium: 3.1 mmol/L — ABNORMAL LOW (ref 3.5–5.1)
Sodium: 132 mmol/L — ABNORMAL LOW (ref 135–145)
Total Bilirubin: 1.1 mg/dL (ref 0.3–1.2)
Total Protein: 6.5 g/dL (ref 6.5–8.1)

## 2020-07-26 LAB — LACTATE DEHYDROGENASE: LDH: 170 U/L (ref 98–192)

## 2020-07-26 LAB — RESP PANEL BY RT-PCR (FLU A&B, COVID) ARPGX2
Influenza A by PCR: NEGATIVE
Influenza B by PCR: NEGATIVE
SARS Coronavirus 2 by RT PCR: POSITIVE — AB

## 2020-07-26 LAB — D-DIMER, QUANTITATIVE: D-Dimer, Quant: 3.54 ug/mL-FEU — ABNORMAL HIGH (ref 0.00–0.50)

## 2020-07-26 LAB — LACTIC ACID, PLASMA: Lactic Acid, Venous: 1.6 mmol/L (ref 0.5–1.9)

## 2020-07-26 LAB — PROCALCITONIN: Procalcitonin: 0.3 ng/mL

## 2020-07-26 MED ORDER — SODIUM CHLORIDE 0.9 % IV SOLN
100.0000 mg | INTRAVENOUS | Status: AC
  Administered 2020-07-26 (×2): 100 mg via INTRAVENOUS

## 2020-07-26 MED ORDER — METHYLPREDNISOLONE SODIUM SUCC 125 MG IJ SOLR
1.0000 mg/kg | Freq: Two times a day (BID) | INTRAMUSCULAR | Status: DC
  Administered 2020-07-26 – 2020-07-30 (×9): 62.5 mg via INTRAVENOUS
  Administered 2020-07-31: 6.25 mg via INTRAVENOUS
  Administered 2020-07-31: 62.5 mg via INTRAVENOUS
  Administered 2020-08-01: 6.25 mg via INTRAVENOUS
  Administered 2020-08-01 – 2020-08-03 (×4): 62.5 mg via INTRAVENOUS
  Filled 2020-07-26 (×16): qty 2

## 2020-07-26 MED ORDER — SODIUM CHLORIDE 0.9 % IV BOLUS
500.0000 mL | Freq: Once | INTRAVENOUS | Status: AC
  Administered 2020-07-26: 500 mL via INTRAVENOUS

## 2020-07-26 MED ORDER — SODIUM CHLORIDE 0.9 % IV SOLN
100.0000 mg | Freq: Every day | INTRAVENOUS | Status: AC
  Administered 2020-07-27 – 2020-07-30 (×4): 100 mg via INTRAVENOUS
  Filled 2020-07-26 (×3): qty 20

## 2020-07-26 NOTE — ED Notes (Signed)
Here for evaluation for a cough and congestion, has had robitussin, no results, prod cough of light green. Has had fevers, was given ASA.

## 2020-07-26 NOTE — ED Notes (Signed)
ED Provider at bedside. °

## 2020-07-26 NOTE — ED Notes (Signed)
Appetite very good, no nausea, vomiting or diarrhea

## 2020-07-26 NOTE — ED Notes (Signed)
Covid Swab obtained and to the lab 

## 2020-07-26 NOTE — ED Provider Notes (Signed)
[facility] EMERGENCY DEPARTMENT Provider Note  CSN: [id number] Arrival date & time: 07/26/20 1828    History Chief Complaint  Patient presents with   Cough    HPI  Teresa Ward is a 87 y.o. female with prior history of HTN, stroke and hypothyroid still lives at home but has family with her 24 hours a day. She is accompanied today by her son-in-law who has been taking care of her the last few days. He noticed a cough 4 days ago that has been getting gradually worse. Not improved with OTC meds. Today she began running a fever as high as 104F at home. Given ASA prior to coming to the ED. She has not had Covid vaccine. No definite Covid exposures, although SIL reports the patient's nephew's girlfriend works in day care and has had a 'head cold' she thought was allergies. Patient denies any CP, Abd pain, N/V/D. No dysuria.    Past Medical History:  Diagnosis Date   Hypertension    Stroke [facility])    Thyroid disease     Past Surgical History:  Procedure Laterality Date   JOINT REPLACEMENT      No family history on file.  Social History   Tobacco Use   Smoking status: Never Smoker  Substance Use Topics   Alcohol use: Not Currently   Drug use: Not Currently     Home Medications Prior to Admission medications   Not on File     Allergies    Patient has no known allergies.   Review of Systems   Review of Systems A comprehensive review of systems was completed and negative except as noted in HPI.    Physical Exam BP 116/64    Pulse 92    Temp 98.2 F (36.8 C) (Oral)    Resp (!) 28    Wt 62.2 kg    SpO2 100%   Physical Exam Vitals and nursing note reviewed.  Constitutional:      Appearance: Normal appearance.  HENT:     Head: Normocephalic and atraumatic.     Nose: Nose normal.     Mouth/Throat:     Mouth: Mucous membranes are moist.  Eyes:     Extraocular Movements: Extraocular movements intact.     Conjunctiva/sclera: Conjunctivae  normal.  Cardiovascular:     Rate and Rhythm: Normal rate.  Pulmonary:     Effort: Pulmonary effort is normal.     Breath sounds: Stridor present. Rhonchi present.  Abdominal:     General: Abdomen is flat.     Palpations: Abdomen is soft.     Tenderness: There is no abdominal tenderness.  Musculoskeletal:        General: No swelling. Normal range of motion.     Cervical back: Neck supple.  Skin:    General: Skin is warm and dry.  Neurological:     General: No focal deficit present.     Mental Status: She is alert.  Psychiatric:        Mood and Affect: Mood normal.      ED Results / Procedures / Treatments   Labs (all labs ordered are listed, but only abnormal results are displayed) Labs Reviewed  RESP PANEL BY RT-PCR (FLU A&B, COVID) ARPGX2 - Abnormal; Notable for the following components:      Result Value   SARS Coronavirus 2 by RT PCR POSITIVE (*)    All other components within normal limits  CBC WITH DIFFERENTIAL/PLATELET - Abnormal; Notable for  the following components:   Hemoglobin 11.9 (*)    Lymphs Abs 0.3 (*)    Monocytes Absolute 1.1 (*)    All other components within normal limits  COMPREHENSIVE METABOLIC PANEL - Abnormal; Notable for the following components:   Sodium 132 (*)    Potassium 3.1 (*)    Chloride 95 (*)    Glucose, Bld 140 (*)    Creatinine, Ser 1.24 (*)    Calcium 8.7 (*)    Albumin 3.3 (*)    AST 48 (*)    GFR, Estimated 42 (*)    All other components within normal limits  D-DIMER, QUANTITATIVE (NOT AT [facility]) - Abnormal; Notable for the following components:   D-Dimer, Quant 3.54 (*)    All other components within normal limits  CULTURE, BLOOD (ROUTINE X 2)  CULTURE, BLOOD (ROUTINE X 2)  LACTIC ACID, PLASMA  LACTATE DEHYDROGENASE  TRIGLYCERIDES  LACTIC ACID, PLASMA  PROCALCITONIN  FIBRINOGEN  C-REACTIVE PROTEIN  FERRITIN    EKG EKG Interpretation  Date/Time:  Monday July 26 2020 20:52:07 EST Ventricular Rate:  84 PR  Interval:    QRS Duration: 87 QT Interval:  345 QTC Calculation: 408 R Axis:   36 Text Interpretation: Sinus rhythm Multiple ventricular premature complexes Borderline T wave abnormalities No old tracing to compare Confirmed by Susy Frizzle [phone]) on 07/26/2020 9:07:30 PM    Radiology DG Chest Portable 1 View  Result Date: 07/26/2020 CLINICAL DATA:  Cough, fever for 2 days EXAM: PORTABLE CHEST 1 VIEW COMPARISON:  Radiograph 04/02/2020 FINDINGS: Some increasing interstitial and patchy opacities towards the lung bases with diffuse airways thickening. No pneumothorax. Trace right and small left pleural effusion lateral pleural thickening. The aorta is calcified. The remaining cardiomediastinal contours are unremarkable. No acute osseous or soft tissue abnormality. Degenerative changes are present in the imaged spine and shoulders. IMPRESSION: 1. Increasing interstitial and patchy opacities towards the lung bases with diffuse airways thickening, compatible with multifocal pneumonia in the setting of fever. 2. Trace right and small left pleural effusions. Electronically Signed   By: Kreg Shropshire M.D.   On: 07/26/2020 19:03    Procedures .Critical Care Performed by: Pollyann Savoy, MD Authorized by: Pollyann Savoy, MD   Critical care provider statement:    Critical care time (minutes):  45   Critical care time was exclusive of:  Separately billable procedures and treating other patients   Critical care was necessary to treat or prevent imminent or life-threatening deterioration of the following conditions:  Respiratory failure   Critical care was time spent personally by me on the following activities:  Discussions with consultants, evaluation of patient's response to treatment, examination of patient, ordering and performing treatments and interventions, ordering and review of laboratory studies, ordering and review of radiographic studies, pulse oximetry, re-evaluation of patient's  condition, obtaining history from patient or surrogate and review of old charts    Medications Ordered in the ED Medications  methylPREDNISolone sodium succinate (SOLU-MEDROL) 125 mg/2 mL injection 62.5 mg (has no administration in time range)  sodium chloride 0.9 % bolus 500 mL (500 mLs Intravenous New Bag/Given 07/26/20 2102)     MDM Rules/Calculators/A&P MDM Patient noted to drop SpO2 to 88% while I was in the room. She coughed and it improved to low 90s. Will place on 2L [place]. Suspect she has Covid particularly given her CXR findings. Will initiate the Covid admission order set.   ED Course  I have reviewed the triage vital signs  and the nursing notes.  Pertinent labs & imaging results that were available during my care of the patient were reviewed by me and considered in my medical decision making (see chart for details).  Clinical Course as of 07/26/20 2313  Mon Jul 26, 2020  2112 WBC is normal mild lymphopenia. Lactic acid is normal.  [CS]  2119 CMP with mild decrease in Na, K, Cl. Mildly elevated Cr, no old for comparison.  [CS]  2152 Covid is confirmed positive. Will plan admission for further management. Steroids and Remdesivir ordered.  [CS]  2313 Care of the patient signed out to Dr. Eudelia Bunch at the change of shift pending call-back from Hospitalist.  [CS]    Clinical Course User Index [CS] Pollyann Savoy, MD    Final Clinical Impression(s) / ED Diagnoses Final diagnoses:  COVID-19  Acute respiratory failure with hypoxia [facility])    Rx / DC Orders ED Discharge Orders    None       Pollyann Savoy, MD 07/26/20 2224

## 2020-07-26 NOTE — ED Triage Notes (Signed)
C/o cough , fever x 2 days

## 2020-07-26 NOTE — ED Notes (Signed)
Has Not Rec Covid Vaccines

## 2020-07-26 NOTE — ED Notes (Signed)
Pt is ill appearing, pale, has a very strong congested cough, IV x 2 established, EDP in to evaluate pt, lab tubes obtained and to the lab, covid swab also obtained as well. Spoke with EDP of RNs concerns

## 2020-07-27 DIAGNOSIS — E039 Hypothyroidism, unspecified: Secondary | ICD-10-CM

## 2020-07-27 DIAGNOSIS — I10 Essential (primary) hypertension: Secondary | ICD-10-CM

## 2020-07-27 DIAGNOSIS — D638 Anemia in other chronic diseases classified elsewhere: Secondary | ICD-10-CM

## 2020-07-27 DIAGNOSIS — F419 Anxiety disorder, unspecified: Secondary | ICD-10-CM

## 2020-07-27 DIAGNOSIS — J1282 Pneumonia due to coronavirus disease 2019: Secondary | ICD-10-CM

## 2020-07-27 DIAGNOSIS — N3281 Overactive bladder: Secondary | ICD-10-CM

## 2020-07-27 DIAGNOSIS — E785 Hyperlipidemia, unspecified: Secondary | ICD-10-CM

## 2020-07-27 DIAGNOSIS — Z8673 Personal history of transient ischemic attack (TIA), and cerebral infarction without residual deficits: Secondary | ICD-10-CM

## 2020-07-27 DIAGNOSIS — U071 COVID-19: Secondary | ICD-10-CM | POA: Diagnosis present

## 2020-07-27 DIAGNOSIS — E876 Hypokalemia: Secondary | ICD-10-CM | POA: Diagnosis present

## 2020-07-27 DIAGNOSIS — Z7982 Long term (current) use of aspirin: Secondary | ICD-10-CM | POA: Diagnosis not present

## 2020-07-27 DIAGNOSIS — J9601 Acute respiratory failure with hypoxia: Secondary | ICD-10-CM | POA: Diagnosis present

## 2020-07-27 DIAGNOSIS — Z66 Do not resuscitate: Secondary | ICD-10-CM | POA: Diagnosis present

## 2020-07-27 DIAGNOSIS — R1310 Dysphagia, unspecified: Secondary | ICD-10-CM | POA: Diagnosis present

## 2020-07-27 DIAGNOSIS — N179 Acute kidney failure, unspecified: Secondary | ICD-10-CM | POA: Diagnosis present

## 2020-07-27 DIAGNOSIS — Z6821 Body mass index (BMI) 21.0-21.9, adult: Secondary | ICD-10-CM | POA: Diagnosis not present

## 2020-07-27 DIAGNOSIS — I69398 Other sequelae of cerebral infarction: Secondary | ICD-10-CM | POA: Diagnosis not present

## 2020-07-27 DIAGNOSIS — D72823 Leukemoid reaction: Secondary | ICD-10-CM | POA: Diagnosis present

## 2020-07-27 DIAGNOSIS — R64 Cachexia: Secondary | ICD-10-CM | POA: Diagnosis present

## 2020-07-27 DIAGNOSIS — T380X5A Adverse effect of glucocorticoids and synthetic analogues, initial encounter: Secondary | ICD-10-CM | POA: Diagnosis present

## 2020-07-27 DIAGNOSIS — H532 Diplopia: Secondary | ICD-10-CM | POA: Diagnosis present

## 2020-07-27 LAB — URINALYSIS, ROUTINE W REFLEX MICROSCOPIC
Glucose, UA: NEGATIVE mg/dL
Hgb urine dipstick: NEGATIVE
Ketones, ur: NEGATIVE mg/dL
Leukocytes,Ua: NEGATIVE
Nitrite: NEGATIVE
Protein, ur: 30 mg/dL — AB
Specific Gravity, Urine: 1.03 (ref 1.005–1.030)
pH: 5 (ref 5.0–8.0)

## 2020-07-27 LAB — URINALYSIS, MICROSCOPIC (REFLEX)

## 2020-07-27 LAB — BASIC METABOLIC PANEL
Anion gap: 9 (ref 5–15)
BUN: 25 mg/dL — ABNORMAL HIGH (ref 8–23)
CO2: 24 mmol/L (ref 22–32)
Calcium: 8.1 mg/dL — ABNORMAL LOW (ref 8.9–10.3)
Chloride: 101 mmol/L (ref 98–111)
Creatinine, Ser: 0.92 mg/dL (ref 0.44–1.00)
GFR, Estimated: >60 mL/min (ref >60)
Glucose, Bld: 181 mg/dL — ABNORMAL HIGH (ref 70–99)
Potassium: 3.3 mmol/L — ABNORMAL LOW (ref 3.5–5.1)
Sodium: 134 mmol/L — ABNORMAL LOW (ref 135–145)

## 2020-07-27 LAB — C-REACTIVE PROTEIN: CRP: 13.8 mg/dL — ABNORMAL HIGH (ref <1.0)

## 2020-07-27 LAB — CBG MONITORING, ED: Glucose-Capillary: 139 mg/dL — ABNORMAL HIGH (ref 70–99)

## 2020-07-27 LAB — FERRITIN: Ferritin: 653 ng/mL — ABNORMAL HIGH (ref 11–307)

## 2020-07-27 LAB — LACTIC ACID, PLASMA: Lactic Acid, Venous: 1.4 mmol/L (ref 0.5–1.9)

## 2020-07-27 LAB — FIBRINOGEN: Fibrinogen: 744 mg/dL — ABNORMAL HIGH (ref 210–475)

## 2020-07-27 MED ORDER — POLYETHYLENE GLYCOL 3350 17 G PO PACK: 17.0000 g | PACK | Freq: Every day | ORAL | Status: DC | PRN

## 2020-07-27 MED ORDER — SODIUM CHLORIDE 0.9 % IV BOLUS
500.0000 mL | Freq: Once | INTRAVENOUS | Status: AC
  Administered 2020-07-27: 500 mL via INTRAVENOUS

## 2020-07-27 MED ORDER — LEVOTHYROXINE SODIUM 75 MCG PO TABS
75.0000 ug | ORAL_TABLET | Freq: Every day | ORAL | Status: DC
  Administered 2020-07-28 – 2020-08-03 (×6): 75 ug via ORAL
  Filled 2020-07-27 (×8): qty 1

## 2020-07-27 MED ORDER — ASCORBIC ACID 500 MG PO TABS
500.0000 mg | ORAL_TABLET | Freq: Every day | ORAL | Status: DC
  Administered 2020-07-27 – 2020-08-03 (×8): 500 mg via ORAL
  Filled 2020-07-27 (×8): qty 1

## 2020-07-27 MED ORDER — ATORVASTATIN CALCIUM 80 MG PO TABS
80.0000 mg | ORAL_TABLET | Freq: Every day | ORAL | Status: DC
  Administered 2020-07-28 – 2020-08-03 (×7): 80 mg via ORAL
  Filled 2020-07-27 (×7): qty 1

## 2020-07-27 MED ORDER — ACETAMINOPHEN 325 MG PO TABS
650.0000 mg | ORAL_TABLET | Freq: Four times a day (QID) | ORAL | Status: DC | PRN
  Administered 2020-07-28: 650 mg via ORAL
  Filled 2020-07-27: qty 2

## 2020-07-27 MED ORDER — ZINC SULFATE 220 (50 ZN) MG PO CAPS
220.0000 mg | ORAL_CAPSULE | Freq: Every day | ORAL | Status: DC
  Administered 2020-07-27 – 2020-08-03 (×8): 220 mg via ORAL
  Filled 2020-07-27 (×9): qty 1

## 2020-07-27 MED ORDER — SODIUM CHLORIDE 0.9% FLUSH
3.0000 mL | Freq: Two times a day (BID) | INTRAVENOUS | Status: DC
  Administered 2020-07-27 – 2020-08-03 (×15): 3 mL via INTRAVENOUS

## 2020-07-27 MED ORDER — POTASSIUM CHLORIDE 10 MEQ/100ML IV SOLN
10.0000 meq | Freq: Once | INTRAVENOUS | Status: AC
  Administered 2020-07-27: 10 meq via INTRAVENOUS
  Filled 2020-07-27: qty 100

## 2020-07-27 MED ORDER — GUAIFENESIN-DM 100-10 MG/5ML PO SYRP: 10.0000 mL | ORAL_SOLUTION | ORAL | Status: DC | PRN

## 2020-07-27 MED ORDER — DULOXETINE HCL 60 MG PO CPEP
60.0000 mg | ORAL_CAPSULE | Freq: Every day | ORAL | Status: DC
  Administered 2020-07-28 – 2020-08-03 (×7): 60 mg via ORAL
  Filled 2020-07-27 (×7): qty 1

## 2020-07-27 MED ORDER — IPRATROPIUM-ALBUTEROL 20-100 MCG/ACT IN AERS
1.0000 | INHALATION_SPRAY | Freq: Four times a day (QID) | RESPIRATORY_TRACT | Status: DC | PRN
  Filled 2020-07-27: qty 4

## 2020-07-27 MED ORDER — ENOXAPARIN SODIUM 40 MG/0.4ML ~~LOC~~ SOLN
40.0000 mg | Freq: Every day | SUBCUTANEOUS | Status: DC
  Administered 2020-07-27 – 2020-08-03 (×8): 40 mg via SUBCUTANEOUS
  Filled 2020-07-27 (×8): qty 0.4

## 2020-07-27 MED ORDER — MIRABEGRON ER 25 MG PO TB24
50.0000 mg | ORAL_TABLET | Freq: Every day | ORAL | Status: DC
  Administered 2020-07-28 – 2020-08-03 (×7): 50 mg via ORAL
  Filled 2020-07-27 (×7): qty 2

## 2020-07-27 MED ORDER — HYDROCOD POLST-CPM POLST ER 10-8 MG/5ML PO SUER: 5.0000 mL | Freq: Two times a day (BID) | ORAL | Status: DC | PRN

## 2020-07-27 NOTE — ED Notes (Signed)
Son in law updated on condition.

## 2020-07-27 NOTE — ED Notes (Signed)
Urine noted to be coke colored.  Physician made aware.  New canister applied to collect specimen.  Repositioned for comfort.  Patient noted to be confused.  Bed in lowest position with side rails up and call bell within reach.

## 2020-07-27 NOTE — ED Notes (Signed)
Pts Daughter called and updated on being tx to [facility]. Provided Room Number.

## 2020-07-27 NOTE — Plan of Care (Signed)
No COVID beds currently at [facility] long so I was was asked by the Flow Manager to change the admission status to [facility] as we have a open COVID bed here.  New orders for admission bed at [facility] placed.  Patient will be seen by [facility] once she arrives to [facility] for admission.

## 2020-07-27 NOTE — ED Notes (Signed)
Repositioned for comfort.  Sacrum noted to be reddened but blanchable.  Allevyn applied for prevention.

## 2020-07-27 NOTE — H&P (Signed)
History and Physical   Emerly Ward HUD:[id number] DOB: [birth year] DOA: 07/26/2020  PCP: Gillian Scarce, MD   Patient coming from: Home  Chief Complaint: Cough, fever  HPI: Teresa Ward is a 87 y.o. female with medical history significant of hyperlipidemia, hypertension, hypothyroidism, CVA, anxiety, anemia of chronic disease, overactive bladder who presents with cough and fever.  Patient lives at home but has family around 24/7.  She was evaluated at [facility] yesterday with her son-in-law present.  Some history obtained with the assistance of chart review and family.  She had been staying with her recently and reported 4 days of cough which have been worsening.  He had also noticed a fever as high as 104 the day that she presented.  Patient is unvaccinated and has had no known sick contacts, however son-in-law states there was a family member who had a head cold recently that was thought to be due to allergies. Patient denies chest pain, abdominal pain, nausea, vomiting, constipation, diarrhea.  ED Course: Vital signs in ED significant for blood pressure in the 100s to 120s systolic, respiratory rate in the 20s, heart rate in the 90s to 100s, saturating in the low 90s on 2 L nasal cannula.  Lab work-up showed CMP with sodium of 132 improved to 134 and repeat, potassium 3.1 improved to 3.3 on repeat, chloride 95 improved to normal on repeat, creatinine 1.24 improved to 0.9 on repeat, glucose elevated at 140.  Calcium of 8.7 which corrects to normal with albumin 3.3.  AST 48.  CBC with hemoglobin of 11.9 which is stable.  Urinalysis with bilirubin and protein and small bacteria.  Blood cultures pending, respiratory panel from COVID positive, lactic acid negative x2.  Inflammatory markers; D-dimer 3.54, procalcitonin 0.3, LDH 170, TG 64, fibrinogen 744, CRP 13.8, ferritin 653.  Started on remdesivir and steroids and accepted for admission initially to [facility] but due to lack of beds  this was switched to [facility].  Of note she has a second chart flagged for merger  Review of Systems: As per HPI otherwise all other systems reviewed and are negative.  Past Medical History:  Diagnosis Date   Hypertension    Stroke [facility])    Thyroid disease     Past Surgical History:  Procedure Laterality Date   JOINT REPLACEMENT      Social History  reports that she has never smoked. She does not have any smokeless tobacco history on file. She reports previous alcohol use. She reports previous drug use.  No Known Allergies Of note she has a second chart flagged for merger  No family history on file. Of note she has a second chart flagged for merger  Prior to Admission medications   Medication Sig Start Date End Date Taking? Authorizing Provider  atorvastatin (LIPITOR) 80 MG tablet Take 80 mg by mouth daily. 05/24/20  Yes [provider]  DULoxetine (CYMBALTA) 60 MG capsule Take 60 mg by mouth daily. 07/15/20  Yes [provider]  levothyroxine (SYNTHROID) 75 MCG tablet Take 75 mcg by mouth daily. 07/17/20  Yes [provider]  MYRBETRIQ 50 MG TB24 tablet Take 50 mg by mouth daily. 07/24/20  Yes [provider]  valsartan (DIOVAN) 160 MG tablet Take 160 mg by mouth daily. 07/14/20  Yes [provider]    Physical Exam: Vitals:   07/27/20 1430 07/27/20 1700 07/27/20 1730 07/27/20 1931  BP: (!) 127/57 115/77 115/69 107/60  Pulse: 94 95  84  Resp: (!) 24 (!) 27 (!) 27   Temp: 99 F (37.2 C)   98.1 F (36.7 C)  TempSrc:    Oral  SpO2: 95% 91%  93%  Weight:    59.9 kg   Physical Exam Constitutional:      General: She is not in acute distress.    Appearance: Normal appearance.     Comments: Elderly female  HENT:     Head: Normocephalic and atraumatic.     Mouth/Throat:     Mouth: Mucous membranes are moist.     Pharynx: Oropharynx is clear.  Eyes:     Extraocular Movements: Extraocular movements intact.     Pupils:  Pupils are equal, round, and reactive to light.  Cardiovascular:     Rate and Rhythm: Normal rate and regular rhythm.     Pulses: Normal pulses.     Heart sounds: Normal heart sounds.  Pulmonary:     Effort: Pulmonary effort is normal. No respiratory distress.     Breath sounds: Normal breath sounds.     Comments: Trace basilar rales Abdominal:     General: Bowel sounds are normal. There is no distension.     Palpations: Abdomen is soft.     Tenderness: There is no abdominal tenderness.  Musculoskeletal:        General: No swelling or deformity.  Skin:    General: Skin is warm and dry.  Neurological:     General: No focal deficit present.     Mental Status: Mental status is at baseline.     Comments: Tired when seen, soft-spoken, but alert.    Labs on Admission: I have personally reviewed following labs and imaging studies  CBC: Recent Labs  Lab 07/26/20 2100  WBC 8.3  NEUTROABS 6.5  HGB 11.9*  HCT 37.0  MCV 93.7  PLT 218    Basic Metabolic Panel: Recent Labs  Lab 07/26/20 2100 07/27/20 0751  NA 132* 134*  K 3.1* 3.3*  CL 95* 101  CO2 24 24  GLUCOSE 140* 181*  BUN 21 25*  CREATININE 1.24* 0.92  CALCIUM 8.7* 8.1*    GFR: CrCl cannot be calculated (Unknown ideal weight.).  Liver Function Tests: Recent Labs  Lab 07/26/20 2100  AST 48*  ALT 34  ALKPHOS 99  BILITOT 1.1  PROT 6.5  ALBUMIN 3.3*    Urine analysis:    Component Value Date/Time   COLORURINE BROWN (A) 07/27/2020 0924   APPEARANCEUR CLOUDY (A) 07/27/2020 0924   LABSPEC 1.030 07/27/2020 0924   PHURINE 5.0 07/27/2020 0924   GLUCOSEU NEGATIVE 07/27/2020 0924   HGBUR NEGATIVE 07/27/2020 0924   BILIRUBINUR LARGE (A) 07/27/2020 0924   KETONESUR NEGATIVE 07/27/2020 0924   PROTEINUR 30 (A) 07/27/2020 0924   NITRITE NEGATIVE 07/27/2020 0924   LEUKOCYTESUR NEGATIVE 07/27/2020 0924    Radiological Exams on Admission: DG Chest Portable 1 View  Result Date: 07/26/2020 CLINICAL DATA:   Cough, fever for 2 days EXAM: PORTABLE CHEST 1 VIEW COMPARISON:  Radiograph 04/02/2020 FINDINGS: Some increasing interstitial and patchy opacities towards the lung bases with diffuse airways thickening. No pneumothorax. Trace right and small left pleural effusion lateral pleural thickening. The aorta is calcified. The remaining cardiomediastinal contours are unremarkable. No acute osseous or soft tissue abnormality. Degenerative changes are present in the imaged spine and shoulders. IMPRESSION: 1. Increasing interstitial and patchy opacities towards the lung bases with diffuse airways thickening, compatible with multifocal pneumonia in the setting of fever. 2. Trace right and  small left pleural effusions. Electronically Signed   By: Kreg Shropshire M.D.   On: 07/26/2020 19:03   EKG: Independently reviewed.  Sinus rhythm at 84 bpm, PVCs noted, some T wave flattening.  Assessment/Plan Principal Problem:   Pneumonia due to COVID-19 virus Active Problems:   COVID-19 virus infection   Hypothyroid   HTN (hypertension)   HLD (hyperlipidemia)   History of CVA (cerebrovascular accident)   Anxiety   Anemia of chronic disease   Overactive bladder  Pneumonia due to COVID-19 Acute hypoxic respiratory failure > Patient with 4 days of cough and 1 day of fever found to require 2 L and have interstitial and patchy opacities on chest x-ray. > Started on remdesivir and Solu-Medrol in the ED > Unvaccinated - Continue Solu-Medrol - Continue remdesivir - Pulmonary hygiene - As needed cough suppressant - Zinc, vitamin C - As needed inhaler - Daily CBC, CMP, CRP, D-dimer, ferritin, mag, Phos  Hypertension > On valsartan hydrochlorothiazide at home > Soft blood pressures in the ED in the 100s to 120s - Holding home antihypertensives for now  Hypokalemia ?  AKI > Potassium of 3.1, 3.3 on repeat.  Creatinine of 1.24, 0.9 on repeat after fluids. > Likely AKI on admission which improved with IV fluids in ED -  We will give additional 40 mill equivalents p.o. potassium - Check magnesium - Avoid nephrotoxic agents - Trend renal function electrolytes  Overactive bladder - Continue Myrbetriq  Hyperlipidemia History of CVA > Daughter states that at baseline she sleeps a lot and she will frequently sleeping during the day and not have breakfast.  She does like coffee and juice and that will help to temperature to eat.  She has to be awoken to eat.  She feeds her self mostly.  She does have residual double vision and issues with ambulation from her stroke.  She is also very soft-spoken. - Continue home aspirin and atorvastatin  Hypothyroidism - Continue home Synthroid 75  Anemia of chronic disease > Hemoglobin stable at 11.9 - Continue to monitor  DVT prophylaxis: Lovenox Code Status:   DNR, confirmed with daughter Family Communication:  Spoke with daughter by phone Disposition Plan:   Patient is from:  Home  Anticipated DC to:  Pending clinical course  Anticipated DC date:  Pending clinical course  Anticipated DC barriers: None  Consults called:  None Admission status:  Inpatient, telemetry   Severity of Illness: The appropriate patient status for this patient is INPATIENT. Inpatient status is judged to be reasonable and necessary in order to provide the required intensity of service to ensure the patient's safety. The patient's presenting symptoms, physical exam findings, and initial radiographic and laboratory data in the context of their chronic comorbidities is felt to place them at high risk for further clinical deterioration. Furthermore, it is not anticipated that the patient will be medically stable for discharge from the hospital within 2 midnights of admission. The following factors support the patient status of inpatient.   " The patient's presenting symptoms include cough, fever. " The worrisome physical exam findings include trace basilar rales. " The initial radiographic and  laboratory data are worrisome because of AKI creatinine 1.24 from a baseline around 0.9, sodium 132, potassium 3.1, hemoglobin 11.9, chest x-ray with interstitial and patchy opacities, COVID positive. " The chronic co-morbidities include hyperlipidemia, hypertension, hypothyroidism, prior CVA, anxiety, anemia of chronic disease, overactive bladder.   * I certify that at the point of admission it is my clinical judgment  that the patient will require inpatient hospital care spanning beyond 2 midnights from the point of admission due to high intensity of service, high risk for further deterioration and high frequency of surveillance required.Synetta Fail*   Rafeal Skibicki B Ariani Seier MD [facility]  How to contact the [facility] Attending or Consulting provider 7A - 7P or covering provider during after hours 7P -7A, for this patient?   1. Check the care team in [facility] and look for a) attending/consulting [facility] provider listed and b) the [facility] team listed 2. Log into [email or web address] and use [place]'s universal password to access. If you do not have the password, please contact the hospital operator. 3. Locate the [facility] provider you are looking for under [facility] and page to a number that you can be directly reached. 4. If you still have difficulty reaching the provider, please page the [facility] (Director on Call) for the Hospitalists listed on amion for assistance.  07/27/2020, 10:08 PM

## 2020-07-27 NOTE — ED Notes (Signed)
[facility] TEAM AT BEDSIDE 

## 2020-07-27 NOTE — ED Notes (Signed)
Spoke with daughter by phone, updated on pt current status and plan of care, informed daughter we are still awaiting a room assignment at [facility].

## 2020-07-28 DIAGNOSIS — U071 COVID-19: Secondary | ICD-10-CM | POA: Diagnosis not present

## 2020-07-28 DIAGNOSIS — J1282 Pneumonia due to coronavirus disease 2019: Secondary | ICD-10-CM | POA: Diagnosis not present

## 2020-07-28 LAB — COMPREHENSIVE METABOLIC PANEL
ALT: 32 U/L (ref 0–44)
AST: 44 U/L — ABNORMAL HIGH (ref 15–41)
Albumin: 2.4 g/dL — ABNORMAL LOW (ref 3.5–5.0)
Alkaline Phosphatase: 87 U/L (ref 38–126)
Anion gap: 13 (ref 5–15)
BUN: 32 mg/dL — ABNORMAL HIGH (ref 8–23)
CO2: 23 mmol/L (ref 22–32)
Calcium: 8.2 mg/dL — ABNORMAL LOW (ref 8.9–10.3)
Chloride: 102 mmol/L (ref 98–111)
Creatinine, Ser: 1.1 mg/dL — ABNORMAL HIGH (ref 0.44–1.00)
GFR, Estimated: 49 mL/min — ABNORMAL LOW (ref >60)
Glucose, Bld: 146 mg/dL — ABNORMAL HIGH (ref 70–99)
Potassium: 3.8 mmol/L (ref 3.5–5.1)
Sodium: 138 mmol/L (ref 135–145)
Total Bilirubin: 1.5 mg/dL — ABNORMAL HIGH (ref 0.3–1.2)
Total Protein: 5.7 g/dL — ABNORMAL LOW (ref 6.5–8.1)

## 2020-07-28 LAB — CBC WITH DIFFERENTIAL/PLATELET
Abs Immature Granulocytes: 0.26 10*3/uL — ABNORMAL HIGH (ref 0.00–0.07)
Basophils Absolute: 0 10*3/uL (ref 0.0–0.1)
Basophils Relative: 0 %
Eosinophils Absolute: 0 10*3/uL (ref 0.0–0.5)
Eosinophils Relative: 0 %
HCT: 37.9 % (ref 36.0–46.0)
Hemoglobin: 12.8 g/dL (ref 12.0–15.0)
Immature Granulocytes: 1 %
Lymphocytes Relative: 2 %
Lymphs Abs: 0.3 10*3/uL — ABNORMAL LOW (ref 0.7–4.0)
MCH: 31 pg (ref 26.0–34.0)
MCHC: 33.8 g/dL (ref 30.0–36.0)
MCV: 91.8 fL (ref 80.0–100.0)
Monocytes Absolute: 1.9 10*3/uL — ABNORMAL HIGH (ref 0.1–1.0)
Monocytes Relative: 9 %
Neutro Abs: 17.9 10*3/uL — ABNORMAL HIGH (ref 1.7–7.7)
Neutrophils Relative %: 88 %
Platelets: 286 10*3/uL (ref 150–400)
RBC: 4.13 MIL/uL (ref 3.87–5.11)
RDW: 15.2 % (ref 11.5–15.5)
WBC Morphology: INCREASED
WBC: 20.3 10*3/uL — ABNORMAL HIGH (ref 4.0–10.5)
nRBC: 0 % (ref 0.0–0.2)

## 2020-07-28 LAB — MAGNESIUM: Magnesium: 2.1 mg/dL (ref 1.7–2.4)

## 2020-07-28 LAB — PHOSPHORUS: Phosphorus: 3.7 mg/dL (ref 2.5–4.6)

## 2020-07-28 LAB — C-REACTIVE PROTEIN: CRP: 14.5 mg/dL — ABNORMAL HIGH (ref <1.0)

## 2020-07-28 LAB — D-DIMER, QUANTITATIVE: D-Dimer, Quant: 6.12 ug/mL-FEU — ABNORMAL HIGH (ref 0.00–0.50)

## 2020-07-28 LAB — FERRITIN: Ferritin: 1237 ng/mL — ABNORMAL HIGH (ref 11–307)

## 2020-07-28 MED ORDER — BARICITINIB 2 MG PO TABS
4.0000 mg | ORAL_TABLET | Freq: Every day | ORAL | Status: DC
  Administered 2020-07-28: 4 mg via ORAL
  Filled 2020-07-28 (×2): qty 2

## 2020-07-28 NOTE — Progress Notes (Signed)
PROGRESS NOTE    Teresa Ward  LEX:[id number] DOB: January 23, 1931 DOA: 07/26/2020 PCP: Gillian Scarce, MD   Brief Narrative:  Fe Okubo is a 87 y.o. female with medical history significant of hyperlipidemia, hypertension, hypothyroidism, CVA, anxiety, anemia of chronic disease, overactive bladder who presents with cough and fever. Patient lives at home but has family around 24/7.  She was evaluated at [facility] - reported 4 days of cough/fever as high as 104 the day that she presented.  Patient is unvaccinated denies recent travel or sick contacts. In ED: Inflammatory markers; D-dimer 3.54, procalcitonin 0.3, LDH 170, TG 64, fibrinogen 744, CRP 13.8, ferritin 653.  Started on remdesivir and steroids and accepted for admission initially to [facility] but due to lack of beds this was switched to [facility]  Assessment & Plan:  Pneumonia due to COVID-19 Acute hypoxic respiratory failure, POA -Remdesivir steroids initiated 07/27/2020 -Baricitinib initiated 07/27/2020 -Continue to wean oxygen as tolerated, early ambulation, proning, incentive spirometry and flutter recommended SpO2: 93 % O2 Flow Rate (L/min): 2 L/min Recent Labs    07/26/20 2100 07/26/20 2143 07/28/20 0616  DDIMER 3.54*  --  6.12*  FERRITIN  --  653* 1,237*  LDH 170  --   --   CRP  --  13.8* 14.5*    Hypertension, essential -Borderline hypotension in the ED likely secondary to above -Hold home medications including valsartan and HCTZ -Restart home medications once indicated  AKI without history of CKD, resolving Hypokalemia, mild -Continue to increase p.o. intake as tolerated -Potassium currently within normal limits  Overactive bladder, chronic history of - Continue Myrbetriq  History of CVA Hyperlipidemia -Continue home aspirin and statin -Baseline somewhat limited, sleeps majority of the day, skips meals on occasion, liquids appear to be majority of her diet. She has to be awoken to eat.   She feeds her self mostly.   Hypothyroidism - Continue home Synthroid 75  Anemia of chronic disease > Hemoglobin stable at 11.9 - Continue to monitor  DVT prophylaxis: Lovenox Code Status: DNR, confirmed with daughter at admission Family Communication: Daughter updated previously  Status is: Inpatient  Dispo: The patient is from: Home              Anticipated d/c is to: To be determined              Anticipated d/c date is: 48 to 72 hours              Patient currently not medically stable for discharge  Consultants:   None  Procedures:   None   Subjective: No acute issues or events overnight, patient remains somewhat fatigued and weak, difficulty ambulating at bedside today with PT and nursing staff.  She is poorly verbal, very quiet at baseline, difficult to understand but overtly denies any nausea vomiting diarrhea constipation headache fevers or chills.  Objective: Vitals:   07/27/20 1700 07/27/20 1730 07/27/20 1931 07/28/20 0552  BP: 115/77 115/69 107/60 110/61  Pulse: 95  84 96  Resp: (!) 27 (!) 27  18  Temp:   98.1 F (36.7 C) 98.2 F (36.8 C)  TempSrc:   Oral Oral  SpO2: 91%  93%   Weight:   59.9 kg     Intake/Output Summary (Last 24 hours) at 07/28/2020 0733 Last data filed at 07/27/2020 1044 Gross per 24 hour  Intake 600 ml  Output --  Net 600 ml   Filed Weights   07/26/20 2200 07/27/20 1931  Weight: 62.2 kg 59.9 kg    Examination:  General: Somewhat cachectic appearing female, attempting to ambulate out of bed with assistance today no acute distress HEENT:  Normocephalic atraumatic.  Sclerae nonicteric, noninjected.  Extraocular movements intact bilaterally. Neck:  Without mass or deformity.  Trachea is midline. Lungs:  Clear to auscultate bilaterally without rhonchi, wheeze, or rales. Heart:  Regular rate and rhythm.  Without murmurs, rubs, or gallops. Abdomen:  Soft, nontender, nondistended.  Without guarding or rebound. Extremities:  Without cyanosis, clubbing, edema, or obvious deformity. Vascular:  Dorsalis pedis and posterior tibial pulses palpable bilaterally. Skin:  Warm and dry, no erythema, no ulcerations.   Data Reviewed: I have personally reviewed following labs and imaging studies  CBC: Recent Labs  Lab 07/26/20 2100  WBC 8.3  NEUTROABS 6.5  HGB 11.9*  HCT 37.0  MCV 93.7  PLT 218   Basic Metabolic Panel: Recent Labs  Lab 07/26/20 2100 07/27/20 0751  NA 132* 134*  K 3.1* 3.3*  CL 95* 101  CO2 24 24  GLUCOSE 140* 181*  BUN 21 25*  CREATININE 1.24* 0.92  CALCIUM 8.7* 8.1*   GFR: CrCl cannot be calculated (Unknown ideal weight.). Liver Function Tests: Recent Labs  Lab 07/26/20 2100  AST 48*  ALT 34  ALKPHOS 99  BILITOT 1.1  PROT 6.5  ALBUMIN 3.3*   No results for input(s): LIPASE, AMYLASE in the last 168 hours. No results for input(s): AMMONIA in the last 168 hours. Coagulation Profile: No results for input(s): INR, PROTIME in the last 168 hours. Cardiac Enzymes: No results for input(s): CKTOTAL, CKMB, CKMBINDEX, TROPONINI in the last 168 hours. BNP (last 3 results) No results for input(s): PROBNP in the last 8760 hours. HbA1C: No results for input(s): HGBA1C in the last 72 hours. CBG: Recent Labs  Lab 07/27/20 1835  GLUCAP 139*   Lipid Profile: Recent Labs    07/26/20 2100  TRIG 64   Thyroid Function Tests: No results for input(s): TSH, T4TOTAL, FREET4, T3FREE, THYROIDAB in the last 72 hours. Anemia Panel: Recent Labs    07/26/20 2143  FERRITIN 653*   Sepsis Labs: Recent Labs  Lab 07/26/20 2035 07/26/20 2100 07/27/20 0025  PROCALCITON  --  0.30  --   LATICACIDVEN 1.6  --  1.4    Recent Results (from the past 240 hour(s))  Resp Panel by RT-PCR (Flu A&B, Covid) Nasopharyngeal Swab     Status: Abnormal   Collection Time: 07/26/20  8:32 PM   Specimen: Nasopharyngeal Swab; Nasopharyngeal(NP) swabs in vial transport medium  Result Value Ref Range Status    SARS Coronavirus 2 by RT PCR POSITIVE (A) NEGATIVE Final    Comment: RESULT CALLED TO, READ BACK BY AND VERIFIED WITH: LISA ADKINS RN AT 2149 ON 07/26/20 BY [email or web address] (NOTE) SARS-CoV-2 target nucleic acids are DETECTED.  The SARS-CoV-2 RNA is generally detectable in upper respiratory specimens during the acute phase of infection. Positive results are indicative of the presence of the identified virus, but do not rule out bacterial infection or co-infection with other pathogens not detected by the test. Clinical correlation with patient history and other diagnostic information is necessary to determine patient infection status. The expected result is Negative.  Fact Sheet for Patients: BloggerCourse.com  Fact Sheet for Healthcare Providers: SeriousBroker.it  This test is not yet approved or cleared by the [place] FDA and  has been authorized for detection and/or diagnosis of SARS-CoV-2 by FDA under an Emergency Use Authorization (EUA).  This  EUA will remain in effect (meaning this  test can be used) for the duration of  the COVID-19 declaration under Section 564(b)(1) of the Act, 21 U.S.C. section 360bbb-3(b)(1), unless the authorization is terminated or revoked sooner.     Influenza A by PCR NEGATIVE NEGATIVE Final   Influenza B by PCR NEGATIVE NEGATIVE Final    Comment: (NOTE) The Xpert Xpress SARS-CoV-2/FLU/RSV plus assay is intended as an aid in the diagnosis of influenza from Nasopharyngeal swab specimens and should not be used as a sole basis for treatment. Nasal washings and aspirates are unacceptable for Xpert Xpress SARS-CoV-2/FLU/RSV testing.  Fact Sheet for Patients: BloggerCourse.com  Fact Sheet for Healthcare Providers: SeriousBroker.it  This test is not yet approved or cleared by the [place] FDA and has been authorized for detection and/or diagnosis of  SARS-CoV-2 by FDA under an Emergency Use Authorization (EUA). This EUA will remain in effect (meaning this test can be used) for the duration of the COVID-19 declaration under Section 564(b)(1) of the Act, 21 U.S.C. section 360bbb-3(b)(1), unless the authorization is terminated or revoked.  Performed at [facility], [address], [place], [place] [address]          Radiology Studies: DG Chest Portable 1 View  Result Date: 07/26/2020 CLINICAL DATA:  Cough, fever for 2 days EXAM: PORTABLE CHEST 1 VIEW COMPARISON:  Radiograph 04/02/2020 FINDINGS: Some increasing interstitial and patchy opacities towards the lung bases with diffuse airways thickening. No pneumothorax. Trace right and small left pleural effusion lateral pleural thickening. The aorta is calcified. The remaining cardiomediastinal contours are unremarkable. No acute osseous or soft tissue abnormality. Degenerative changes are present in the imaged spine and shoulders. IMPRESSION: 1. Increasing interstitial and patchy opacities towards the lung bases with diffuse airways thickening, compatible with multifocal pneumonia in the setting of fever. 2. Trace right and small left pleural effusions. Electronically Signed   By: Kreg Shropshire M.D.   On: 07/26/2020 19:03        Scheduled Meds: . vitamin C  500 mg Oral Daily  . atorvastatin  80 mg Oral Daily  . DULoxetine  60 mg Oral Daily  . enoxaparin (LOVENOX) injection  40 mg Subcutaneous Daily  . levothyroxine  75 mcg Oral Q0600  . methylPREDNISolone (SOLU-MEDROL) injection  1 mg/kg Intravenous Q12H  . mirabegron ER  50 mg Oral Daily  . sodium chloride flush  3 mL Intravenous Q12H  . zinc sulfate  220 mg Oral Daily   Continuous Infusions: . remdesivir 100 mg in NS 100 mL Stopped (07/27/20 1044)     LOS: 1 day   Time spent:  Azucena Fallen, DO [facility]  If 7PM-7AM, please contact night-coverage [email or web address]  07/28/2020, 7:33 AM

## 2020-07-28 NOTE — Evaluation (Signed)
Occupational Therapy Evaluation Patient Details Name: Teresa Ward MRN: [id number] DOB: 13-Feb-1931 Today's Date: 07/28/2020    History of Present Illness Teresa Ward is a 87 y.o. female with medical history significant of hyperlipidemia, hypertension, hypothyroidism, CVA, anxiety, anemia of chronic disease, overactive bladder who presents with cough and fever. Pt found to be COVID+.   Clinical Impression   PTA, pt lives alone but may have frequent family support/assist at home. Per pt, she is able to ambulate with RW and complete ADLs without assistance. Unsure of PLOF accuracy as pt poor historian and very soft spoken when responding. Pt noted with large loose bowel movement in bed on entry, extended time and Total A needed for cleanup. Pt overall Mod A for bed mobility and transfer to chair with RW with hands on assist needed throughout due to posterior bias. Pt requires Max A for UB ADLs and Total A for LB ADLs at this time. Recommend SNF for short term rehab if 24/7 physical assist unable to be provided at home.   Pt received on RA, SpO2 87-89% at rest, 89-90% sitting EOB and desats to 81% after transfer. Pt increased quickly to 93% with use of 1 L O2. Pt denies any SOB.     Follow Up Recommendations  SNF;Supervision/Assistance - 24 hour (will need 24/7 physical assistance if plan is to discharge home)    Equipment Recommendations  3 in 1 bedside commode;Wheelchair (measurements OT);Wheelchair cushion (measurements OT)    Recommendations for Other Services       Precautions / Restrictions Precautions Precautions: Fall;Other (comment) Precaution Comments: monitor O2, very soft spoken Restrictions Weight Bearing Restrictions: No      Mobility Bed Mobility Overal bed mobility: Needs Assistance Bed Mobility: Supine to Sit     Supine to sit: Mod assist;HOB elevated     General bed mobility comments: Mod A to sit EOB, cues for sequencing and assist with trunk/LE to EOB.  posterior lean throughout    Transfers Overall transfer level: Needs assistance Equipment used: Rolling walker (2 wheeled) Transfers: Sit to/from [employer] Sit to Stand: Mod assist Stand pivot transfers: Mod assist       General transfer comment: Mod A for power up, pt tendency to pull on RW and difficulty following hand placement instructions. Mod A for pivot to recliner with assistance to block/manuver RW, cues to avoid crossing feet and progressive posterior lean    Balance Overall balance assessment: Needs assistance Sitting-balance support: Bilateral upper extremity supported;Feet supported Sitting balance-Leahy Scale: Poor Sitting balance - Comments: posterior lean, able to progress to min guard but overall MIn A Postural control: Posterior lean Standing balance support: Bilateral upper extremity supported;During functional activity Standing balance-Leahy Scale: Poor Standing balance comment: reliant on B UE support and external support to prevent fall                           ADL either performed or assessed with clinical judgement   ADL Overall ADL's : Needs assistance/impaired Eating/Feeding: Minimal assistance;Sitting   Grooming: Minimal assistance;Bed level   Upper Body Bathing: Moderate assistance;Bed level   Lower Body Bathing: Total assistance;Bed level   Upper Body Dressing : Maximal assistance;Bed level Upper Body Dressing Details (indicate cue type and reason): Max A to doff/don clean hospital gown Lower Body Dressing: Total assistance;Bed level Lower Body Dressing Details (indicate cue type and reason): Total A to don new socks EOB, difficulty holding balance EOB Toilet Transfer: Moderate  assistance;Stand-pivot;RW Statistician Details (indicate cue type and reason): simulated to recliner Toileting- Clothing Manipulation and Hygiene: Total assistance;Bed level Toileting - Clothing Manipulation Details (indicate cue type and  reason): Noted with very large loose bowel movement in bed. Total A for cleanup       General ADL Comments: Deficits in endurance, cognition, and balance deficits with posterior bias sitting/standing     Vision Patient Visual Report: No change from baseline Additional Comments: To be further assessed. Pt with fixed gaze during session, unable to fully understand vision reports from pt     Perception     Praxis      Pertinent Vitals/Pain Pain Assessment: Faces Faces Pain Scale: No hurt Pain Intervention(s): Monitored during session     Hand Dominance     Extremity/Trunk Assessment Upper Extremity Assessment Upper Extremity Assessment: Generalized weakness;Difficult to assess due to impaired cognition   Lower Extremity Assessment Lower Extremity Assessment: Defer to PT evaluation   Cervical / Trunk Assessment Cervical / Trunk Assessment: Kyphotic   Communication Communication Communication: Other (comment) (very soft spoken, moves mouth when talking but very low volume)   Cognition Arousal/Alertness: Awake/alert Behavior During Therapy: Flat affect Overall Cognitive Status: No family/caregiver present to determine baseline cognitive functioning Area of Impairment: Orientation;Attention;Following commands;Awareness;Safety/judgement;Problem solving                 Orientation Level: Time;Place Current Attention Level: Sustained   Following Commands: Follows one step commands with increased time Safety/Judgement: Decreased awareness of deficits;Decreased awareness of safety Awareness: Intellectual Problem Solving: Slow processing;Decreased initiation;Difficulty sequencing;Requires verbal cues;Requires tactile cues General Comments: Pt found in large bowel movement, reports she could feel that she had BM but did not call for assistance. Pt reports she is at a hospital in [facility] (noted multiple transfers). Pt with slow processing/initiation. cues for sequencing  needed. perseveration on holding tissue in hand   General Comments  Pt on RA on entry, no O2 probe in place so used dynamap pulse ox with 87-89% at rest, 89-90% sitting EOB on RW and 81% after transfer to chair slowly progressing to 84% and quickly returned to 93% with application of 1 L O2    Exercises     Shoulder Instructions      Home Living Family/patient expects to be discharged to:: Private residence Living Arrangements: Alone Available Help at Discharge: Family;Other (Comment) (pt reports having family nearby/accessible often. Per chart, a family member had been staying there for the past few days) Type of Home: House Home Access: Stairs to enter [employer] of Steps: 2 Entrance Stairs-Rails: None Home Layout: One level     Bathroom Shower/Tub: Chief Strategy Officer: Standard     Home Equipment: Environmental consultant - 2 wheels          Prior Functioning/Environment Level of Independence: Needs assistance  Gait / Transfers Assistance Needed: pt reports walking with RW at home ADL's / Homemaking Assistance Needed: Pt reports able to dress self, bathe self, reports able to toilet. Unsure of IADLs, transportation, etc Communication / Swallowing Assistance Needed: very soft spoken, essentially silent and moving lips when attempting to talk Comments: Unsure of accuracy of PLOF as pt poor historian and very soft spoken        OT Problem List: Decreased strength;Decreased activity tolerance;Impaired balance (sitting and/or standing);Decreased safety awareness;Decreased cognition;Decreased knowledge of use of DME or AE;Cardiopulmonary status limiting activity      OT Treatment/Interventions: Self-care/ADL training;Therapeutic exercise;DME and/or AE instruction;Energy conservation;Therapeutic activities;Patient/family  education;Balance training    OT Goals(Current goals can be found in the care plan section) Acute Rehab OT Goals Patient Stated Goal: get something  to drink OT Goal Formulation: Patient unable to participate in goal setting Time For Goal Achievement: 08/11/20 Potential to Achieve Goals: Fair ADL Goals Pt Will Perform Eating: with modified independence;sitting Pt Will Perform Upper Body Bathing: with set-up;sitting Pt Will Perform Upper Body Dressing: with supervision;sitting Pt Will Transfer to Toilet: with min assist;ambulating;bedside commode Pt Will Perform Toileting - Clothing Manipulation and hygiene: with supervision;sit to/from stand;sitting/lateral leans Additional ADL Goal #1: Pt to demonstrate ability to increase activity tolerance to >3-5 minutes while maintaining >88% SpO2 sats  OT Frequency: Min 2X/week   Barriers to D/C:            Co-evaluation              AM-PAC OT "6 Clicks" Daily Activity     Outcome Measure Help from another person eating meals?: A Little Help from another person taking care of personal grooming?: A Little Help from another person toileting, which includes using toliet, bedpan, or urinal?: Total Help from another person bathing (including washing, rinsing, drying)?: A Lot Help from another person to put on and taking off regular upper body clothing?: A Lot Help from another person to put on and taking off regular lower body clothing?: Total 6 Click Score: 12   End of Session Equipment Utilized During Treatment: Gait belt;Rolling walker;Oxygen Nurse Communication: Mobility status;Other (comment) (O2, bowels. RN present during much of session)  Activity Tolerance: Patient tolerated treatment well Patient left: in chair;with call bell/phone within reach;with nursing/sitter in room  OT Visit Diagnosis: Unsteadiness on feet (R26.81);Other abnormalities of gait and mobility (R26.89);Muscle weakness (generalized) (M62.81);Other symptoms and signs involving cognitive function                Time: 0921-1009 OT Time Calculation (min): 48 min Charges:  OT General Charges $OT Visit: 1  Visit OT Evaluation $OT Eval Moderate Complexity: 1 Mod OT Treatments $Self Care/Home Management : 23-37 mins  Lorre Munroe, OTR/L  Lorre Munroe 07/28/2020, 11:46 AM

## 2020-07-28 NOTE — Evaluation (Signed)
Physical Therapy Evaluation Patient Details Name: Teresa Ward MRN: [id number] DOB: 1931-02-03 Today's Date: 07/28/2020   History of Present Illness  Teresa Ward is a 87 y.o. female with medical history significant of hyperlipidemia, hypertension, hypothyroidism, CVA, anxiety, anemia of chronic disease, overactive bladder who presents with cough and fever. Pt found to be COVID+.  Clinical Impression  Pt presents to PT with deficits in functional mobility, gait, balance, activity tolerance, endurance. PT is very soft spoken and difficult to understand during session, making symptom monitoring difficult. Pt requires physical assistance to reduce falls risk due to posterior lean with all mobility. Pt will benefit from acute PT POC to improve LE strength, balance, and activity tolerance in an effort to reduce falls risk. PT recommends SNF placement at this time.    Follow Up Recommendations SNF    Equipment Recommendations  Wheelchair (measurements PT);Wheelchair cushion (measurements PT)    Recommendations for Other Services       Precautions / Restrictions Precautions Precautions: Fall;Other (comment) Precaution Comments: monitor O2, very very soft spoken Restrictions Weight Bearing Restrictions: No      Mobility  Bed Mobility                    Transfers Overall transfer level: Needs assistance Equipment used: Rolling walker (2 wheeled);1 person hand held assist Transfers: Sit to/from Stand;Stand Pivot Transfers Sit to Stand: Mod assist Stand pivot transfers: Mod assist       General transfer comment: pt performs 5 sit to stands and 2 SPT with PT assist, verbal cues for hand placement, physical assist due to posterior lean  Ambulation/Gait Ambulation/Gait assistance:  (deferred 2/2 falls risk and bowel incontinence)              Stairs            Wheelchair Mobility    Modified Rankin (Stroke Patients Only)       Balance Overall balance  assessment: Needs assistance Sitting-balance support: No upper extremity supported;Feet supported Sitting balance-Leahy Scale: Poor   Postural control: Posterior lean Standing balance support: Bilateral upper extremity supported Standing balance-Leahy Scale: Poor Standing balance comment: reliant on BUE support 2/2 posterior lean                             Pertinent Vitals/Pain Pain Assessment: Faces Faces Pain Scale: No hurt    Home Living Family/patient expects to be discharged to:: Private residence Living Arrangements: Alone Available Help at Discharge: Family;Other (Comment) Type of Home: House Home Access: Stairs to enter Entrance Stairs-Rails: None Entrance Stairs-Number of Steps: 2 Home Layout: One level Home Equipment: Walker - 2 wheels      Prior Function Level of Independence: Needs assistance   Gait / Transfers Assistance Needed: pt reports walking with RW at home  ADL's / Homemaking Assistance Needed: Pt reports able to dress self, bathe self, reports able to toilet. Unsure of IADLs, transportation, etc  Comments: Unsure of accuracy of PLOF as pt poor historian and very soft spoken     Hand Dominance        Extremity/Trunk Assessment   Upper Extremity Assessment Upper Extremity Assessment: Generalized weakness    Lower Extremity Assessment Lower Extremity Assessment: Generalized weakness    Cervical / Trunk Assessment Cervical / Trunk Assessment: Kyphotic  Communication   Communication: Other (comment) (very soft spoken)  Cognition Arousal/Alertness: Awake/alert Behavior During Therapy: Flat affect Overall Cognitive Status: No family/caregiver present to  determine baseline cognitive functioning Area of Impairment: Attention;Following commands;Safety/judgement;Awareness;Problem solving                   Current Attention Level: Sustained   Following Commands: Follows one step commands with increased time Safety/Judgement:  Decreased awareness of safety;Decreased awareness of deficits Awareness: Intellectual Problem Solving: Slow processing        General Comments General comments (skin integrity, edema, etc.): pt on 1.5 L [place] upon entry, requires increase to 4L with mobility due to desat into 70s. Pt incontinent of stool during session, PT assists with hygiene    Exercises     Assessment/Plan    PT Assessment Patient needs continued PT services  PT Problem List Decreased strength;Decreased activity tolerance;Decreased balance;Decreased mobility;Decreased knowledge of use of DME;Decreased safety awareness;Decreased cognition;Decreased knowledge of precautions;Cardiopulmonary status limiting activity       PT Treatment Interventions DME instruction;Gait training;Stair training;Functional mobility training;Therapeutic activities;Balance training;Neuromuscular re-education;Therapeutic exercise;Cognitive remediation;Patient/family education    PT Goals (Current goals can be found in the Care Plan section)  Acute Rehab PT Goals Patient Stated Goal: to improve mobility quality PT Goal Formulation: With patient Time For Goal Achievement: 08/11/20 Potential to Achieve Goals: Fair    Frequency Min 2X/week   Barriers to discharge        Co-evaluation               AM-PAC PT "6 Clicks" Mobility  Outcome Measure Help needed turning from your back to your side while in a flat bed without using bedrails?: A Lot Help needed moving from lying on your back to sitting on the side of a flat bed without using bedrails?: A Lot Help needed moving to and from a bed to a chair (including a wheelchair)?: A Lot Help needed standing up from a chair using your arms (e.g., wheelchair or bedside chair)?: A Lot Help needed to walk in hospital room?: A Lot Help needed climbing 3-5 steps with a railing? : Total 6 Click Score: 11    End of Session Equipment Utilized During Treatment: Oxygen Activity Tolerance:  Patient limited by fatigue Patient left: in chair;with call bell/phone within reach Nurse Communication: Mobility status PT Visit Diagnosis: Unsteadiness on feet (R26.81);Muscle weakness (generalized) (M62.81)    Time: 2025-4270 PT Time Calculation (min) (ACUTE ONLY): 36 min   Charges:   PT Evaluation $PT Eval Moderate Complexity: 1 Mod PT Treatments $Therapeutic Activity: 8-22 mins        Arlyss Gandy, PT, DPT Acute Rehabilitation Pager: [phone]   Arlyss Gandy 07/28/2020, 3:33 PM

## 2020-07-28 NOTE — Plan of Care (Signed)
Patient remains on O2 1.5 L n/c. No respiratory distress noted. Denies pain or discomfort at this time. Safety precautions maintained.

## 2020-07-29 DIAGNOSIS — J1282 Pneumonia due to coronavirus disease 2019: Secondary | ICD-10-CM | POA: Diagnosis not present

## 2020-07-29 DIAGNOSIS — U071 COVID-19: Secondary | ICD-10-CM | POA: Diagnosis not present

## 2020-07-29 LAB — CBC WITH DIFFERENTIAL/PLATELET
Abs Immature Granulocytes: 0.13 10*3/uL — ABNORMAL HIGH (ref 0.00–0.07)
Basophils Absolute: 0.1 10*3/uL (ref 0.0–0.1)
Basophils Relative: 1 %
Eosinophils Absolute: 0 10*3/uL (ref 0.0–0.5)
Eosinophils Relative: 0 %
HCT: 32 % — ABNORMAL LOW (ref 36.0–46.0)
Hemoglobin: 11 g/dL — ABNORMAL LOW (ref 12.0–15.0)
Immature Granulocytes: 1 %
Lymphocytes Relative: 4 %
Lymphs Abs: 0.6 10*3/uL — ABNORMAL LOW (ref 0.7–4.0)
MCH: 30.7 pg (ref 26.0–34.0)
MCHC: 34.4 g/dL (ref 30.0–36.0)
MCV: 89.4 fL (ref 80.0–100.0)
Monocytes Absolute: 1.4 10*3/uL — ABNORMAL HIGH (ref 0.1–1.0)
Monocytes Relative: 9 %
Neutro Abs: 13.7 10*3/uL — ABNORMAL HIGH (ref 1.7–7.7)
Neutrophils Relative %: 85 %
Platelets: 247 10*3/uL (ref 150–400)
RBC: 3.58 MIL/uL — ABNORMAL LOW (ref 3.87–5.11)
RDW: 15 % (ref 11.5–15.5)
WBC: 15.9 10*3/uL — ABNORMAL HIGH (ref 4.0–10.5)
nRBC: 0 % (ref 0.0–0.2)

## 2020-07-29 LAB — COMPREHENSIVE METABOLIC PANEL
ALT: 27 U/L (ref 0–44)
AST: 40 U/L (ref 15–41)
Albumin: 2.1 g/dL — ABNORMAL LOW (ref 3.5–5.0)
Alkaline Phosphatase: 76 U/L (ref 38–126)
Anion gap: 11 (ref 5–15)
BUN: 56 mg/dL — ABNORMAL HIGH (ref 8–23)
CO2: 23 mmol/L (ref 22–32)
Calcium: 7.8 mg/dL — ABNORMAL LOW (ref 8.9–10.3)
Chloride: 100 mmol/L (ref 98–111)
Creatinine, Ser: 1.6 mg/dL — ABNORMAL HIGH (ref 0.44–1.00)
GFR, Estimated: 31 mL/min — ABNORMAL LOW (ref >60)
Glucose, Bld: 180 mg/dL — ABNORMAL HIGH (ref 70–99)
Potassium: 3.9 mmol/L (ref 3.5–5.1)
Sodium: 134 mmol/L — ABNORMAL LOW (ref 135–145)
Total Bilirubin: 0.8 mg/dL (ref 0.3–1.2)
Total Protein: 4.9 g/dL — ABNORMAL LOW (ref 6.5–8.1)

## 2020-07-29 LAB — C-REACTIVE PROTEIN: CRP: 9.6 mg/dL — ABNORMAL HIGH (ref <1.0)

## 2020-07-29 LAB — D-DIMER, QUANTITATIVE: D-Dimer, Quant: 6.13 ug/mL-FEU — ABNORMAL HIGH (ref 0.00–0.50)

## 2020-07-29 LAB — MAGNESIUM: Magnesium: 2.3 mg/dL (ref 1.7–2.4)

## 2020-07-29 LAB — FERRITIN: Ferritin: 1472 ng/mL — ABNORMAL HIGH (ref 11–307)

## 2020-07-29 LAB — PHOSPHORUS: Phosphorus: 3.8 mg/dL (ref 2.5–4.6)

## 2020-07-29 MED ORDER — BOOST PLUS PO LIQD
237.0000 mL | Freq: Three times a day (TID) | ORAL | Status: DC
  Administered 2020-07-30 – 2020-08-03 (×5): 237 mL via ORAL
  Filled 2020-07-29 (×16): qty 237

## 2020-07-29 MED ORDER — BARICITINIB 1 MG PO TABS
1.0000 mg | ORAL_TABLET | Freq: Every day | ORAL | Status: DC
  Administered 2020-07-29 – 2020-07-30 (×2): 1 mg via ORAL
  Filled 2020-07-29 (×3): qty 1

## 2020-07-29 NOTE — Plan of Care (Signed)
Weaning O2. Denies SOB or discomfort. Safety precautions maintained.

## 2020-07-29 NOTE — Plan of Care (Signed)
  Problem: Education: Goal: Knowledge of General Education information will improve Description Including pain rating scale, medication(s)/side effects and non-pharmacologic comfort measures Outcome: Progressing   

## 2020-07-29 NOTE — Progress Notes (Signed)
PHARMACY NOTE:  RENAL DOSAGE ADJUSTMENT  Current regimen includes a mismatch between dosage and estimated renal function.  As per policy approved by the Pharmacy & Therapeutics and Medical Executive Committees, the dosage will be adjusted accordingly.  Current dosage:  Baricitinib 4mg  PO daily  Indication: COVID  Renal Function: CrCl ~ 23 ml/min  CrCl cannot be calculated (Unknown ideal weight.). []      On intermittent HD, scheduled: []      On CRRT    Antimicrobial dosage has been changed to:  Baricitinib 1mg  PO daily  Additional comments:   Thank you for allowing pharmacy to be a part of this patient's care.  , [facility] 07/29/2020 9:32 AM

## 2020-07-29 NOTE — Progress Notes (Signed)
PROGRESS NOTE    Teresa Ward  ZOX:[id number] DOB: 04/21/1931 DOA: 07/26/2020 PCP: Gillian ScarceZanard, Robyn K, MD   Brief Narrative:  Teresa Ward is a [age] y.o. female with medical history significant of hyperlipidemia, hypertension, hypothyroidism, CVA, anxiety, anemia of chronic disease, overactive bladder who presents with cough and fever. Patient lives at home but has family around 24/7.  She was evaluated at [facility] - reported 4 days of cough/fever as high as 104 the day that she presented.  Patient is unvaccinated denies recent travel or sick contacts. In ED: Inflammatory markers; D-dimer 3.54, procalcitonin 0.3, LDH 170, TG 64, fibrinogen 744, CRP 13.8, ferritin 653.  Started on remdesivir and steroids and accepted for admission [facility] to [facility] but due to lack of beds this was switched to [facility]  Assessment & Plan:  Pneumonia due to COVID-19 Acute hypoxic respiratory failure, POA - Remdesivir/steroids initiated 07/27/2020 - Baricitinib initiated 07/27/2020 - Continue to wean oxygen as tolerated, early ambulation, proning, incentive spirometry and flutter recommended SpO2: 100 % O2 Flow Rate (L/min): 4 L/min Recent Labs    07/26/20 2100 07/26/20 2143 07/28/20 0616 07/29/20 0705  DDIMER 3.54*  --  6.12* 6.13*  FERRITIN  --  653* 1,237* 1,472*  LDH 170  --   --   --   CRP  --  13.8* 14.5* 9.6*   Hypertension, essential - Borderline hypotension in the ED likely secondary to above - Hold home medications including valsartan and HCTZ - Restart home medications once indicated  AKI without history of CKD, resolving Hypokalemia, mild - Continue to increase p.o. intake as tolerated - Potassium currently within normal limits  Leukemoid reaction -Secondary to steroids  Overactive bladder, chronic history of - Continue Myrbetriq  History of CVA Hyperlipidemia - Continue home aspirin and statin - Baseline somewhat limited, sleeps majority of the day,  skips meals on occasion, liquids appear to be majority of her diet. She has to be awoken to eat. She feeds her self mostly. 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 ROS limited.  Objective: Vitals:   07/28/20 2107 07/29/20 0612 07/29/20 0800 07/29/20 1416  BP: 117/67 (!) 101/57  (!) 102/58  Pulse: 100 82  76  Resp:    16  Temp: (!) 97.5 F (36.4 C) 97.9 F (36.6 C)  98.3 F (36.8 C)  TempSrc: Oral Oral    SpO2: 91% 91% 90% 100%  Weight:       No intake or output data in the 24 hours ending 07/29/20 1511 Filed Weights   07/26/20 2200 07/27/20 1931  Weight: 62.2 kg 59.9 kg    Examination:  General: Somewhat cachectic appearing  female, somnolent and difficult to arouse. HEENT:  Normocephalic atraumatic.  Sclerae nonicteric, noninjected.  Extraocular movements intact bilaterally. Neck:  Without mass or deformity.  Trachea is midline. Lungs:  Clear to auscultate bilaterally without rhonchi, wheeze, or rales. Heart:  Regular rate and rhythm.  Without murmurs, rubs, or gallops. Abdomen:  Soft, nontender, nondistended.  Without guarding or rebound. Extremities: Without cyanosis, clubbing, edema, or obvious deformity. Vascular:  Dorsalis pedis and posterior tibial pulses palpable bilaterally. Skin:  Warm and dry, no  erythema, no ulcerations.   Data Reviewed: I have personally reviewed following labs and imaging studies  CBC: Recent Labs  Lab 07/26/20 2100 07/28/20 0616 07/29/20 0705  WBC 8.3 20.3* 15.9*  NEUTROABS 6.5 17.9* 13.7*  HGB 11.9* 12.8 11.0*  HCT 37.0 37.9 32.0*  MCV 93.7 91.8 89.4  PLT 218 286 247   Basic Metabolic Panel: Recent Labs  Lab 07/26/20 2100 07/27/20 0751 07/28/20 0616 07/29/20 0705  NA 132* 134* 138 134*  K 3.1* 3.3* 3.8 3.9  CL 95* 101 102 100  CO2 24 24 23 23   GLUCOSE 140* 181* 146* 180*  BUN 21 25* 32* 56*  CREATININE 1.24* 0.92 1.10* 1.60*  CALCIUM 8.7* 8.1* 8.2* 7.8*  MG  --   --  2.1 2.3  PHOS  --   --  3.7 3.8   GFR: CrCl cannot be calculated (Unknown ideal weight.). Liver Function Tests: Recent Labs  Lab 07/26/20 2100 07/28/20 0616 07/29/20 0705  AST 48* 44* 40  ALT 34 32 27  ALKPHOS 99 87 76  BILITOT 1.1 1.5* 0.8  PROT 6.5 5.7* 4.9*  ALBUMIN 3.3* 2.4* 2.1*   No results for input(s): LIPASE, AMYLASE in the last 168 hours. No results for input(s): AMMONIA in the last 168 hours. Coagulation Profile: No results for input(s): INR, PROTIME in the last 168 hours. Cardiac Enzymes: No results for input(s): CKTOTAL, CKMB, CKMBINDEX, TROPONINI in the last 168 hours. BNP (last 3 results) No results for input(s): PROBNP in the last 8760 hours. HbA1C: No results for input(s): HGBA1C in the last 72 hours. CBG: Recent Labs  Lab 07/27/20 1835  GLUCAP 139*   Lipid Profile: Recent Labs    07/26/20 2100  TRIG 64   Thyroid Function Tests: No results for input(s): TSH, T4TOTAL, FREET4, T3FREE, THYROIDAB in the last 72 hours. Anemia Panel: Recent Labs    07/28/20 0616 07/29/20 0705  FERRITIN 1,237* 1,472*   Sepsis Labs: Recent Labs  Lab 07/26/20 2035 07/26/20 2100 07/27/20 0025  PROCALCITON  --  0.30  --   LATICACIDVEN 1.6  --  1.4    Recent Results (from the past 240 hour(s))  Resp Panel by RT-PCR (Flu A&B, Covid)  Nasopharyngeal Swab     Status: Abnormal   Collection Time: 07/26/20  8:32 PM   Specimen: Nasopharyngeal Swab; Nasopharyngeal(NP) swabs in vial transport medium  Result Value Ref Range Status   SARS Coronavirus 2 by RT PCR POSITIVE (A) NEGATIVE Final    Comment: RESULT CALLED TO, READ BACK BY AND VERIFIED WITH: LISA ADKINS RN AT 2149 ON 07/26/20 BY I.[email or web address] (NOTE) SARS-CoV-2 target nucleic acids are DETECTED.  The SARS-CoV-2 RNA is generally detectable in upper respiratory specimens during the acute phase of infection. Positive results are indicative of the presence of the identified virus, but do not rule out bacterial infection or co-infection with other pathogens not detected by the test. Clinical correlation with patient history and other diagnostic information is necessary to determine patient infection  status. The expected result is Negative.  Fact Sheet for Patients: BloggerCourse.com  Fact Sheet for Healthcare Providers: SeriousBroker.it  This test is not yet approved or cleared by the [place] FDA and  has been authorized for detection and/or diagnosis of SARS-CoV-2 by FDA under an Emergency Use Authorization (EUA).  This EUA will remain in effect (meaning this  test can be used) for the duration of  the COVID-19 declaration under Section 564(b)(1) of the Act, 21 U.S.C. section 360bbb-3(b)(1), unless the authorization is terminated or revoked sooner.     Influenza A by PCR NEGATIVE NEGATIVE Final   Influenza B by PCR NEGATIVE NEGATIVE Final    Comment: (NOTE) The Xpert Xpress SARS-CoV-2/FLU/RSV plus assay is intended as an aid in the diagnosis of influenza from Nasopharyngeal swab specimens and should not be used as a sole basis for treatment. Nasal washings and aspirates are unacceptable for Xpert Xpress SARS-CoV-2/FLU/RSV testing.  Fact Sheet for Patients: BloggerCourse.com  Fact Sheet  for Healthcare Providers: SeriousBroker.it  This test is not yet approved or cleared by the [place] FDA and has been authorized for detection and/or diagnosis of SARS-CoV-2 by FDA under an Emergency Use Authorization (EUA). This EUA will remain in effect (meaning this test can be used) for the duration of the COVID-19 declaration under Section 564(b)(1) of the Act, 21 U.S.C. section 360bbb-3(b)(1), unless the authorization is terminated or revoked.  Performed at [facility], [address] [place], [place] [address]   Blood Culture (routine x 2)     Status: None (Preliminary result)   Collection Time: 07/26/20  8:59 PM   Specimen: Left Antecubital; Blood  Result Value Ref Range Status   Specimen Description   Final    LEFT ANTECUBITAL Performed at [facility], [address] [place], [place] [address]    Special Requests   Final    BOTTLES DRAWN AEROBIC AND ANAEROBIC Blood Culture adequate volume Performed at [facility], [address] [place], [place] [address]    Culture   Final    NO GROWTH 2 DAYS Performed at [facility], [address] [place], [place] [address]    Report Status PENDING  Incomplete  Blood Culture (routine x 2)     Status: None (Preliminary result)   Collection Time: 07/26/20  9:10 PM   Specimen: Right Antecubital; Blood  Result Value Ref Range Status   Specimen Description   Final    RIGHT ANTECUBITAL Performed at [facility], [address] [place], [place] [address]    Special Requests   Final    BOTTLES DRAWN AEROBIC AND ANAEROBIC Blood Culture results may not be optimal due to an inadequate volume of blood received in culture bottles Performed at [facility], [address] [place], [place] [address]    Culture   Final    NO GROWTH 2 DAYS Performed at [facility], [address] [place], [place] [address]    Report Status PENDING   Incomplete         Radiology Studies: No results found.      Scheduled Meds: . vitamin C  500 mg Oral Daily  . atorvastatin  80 mg Oral Daily  . baricitinib  1 mg Oral Daily  . DULoxetine  60 mg Oral Daily  . enoxaparin (LOVENOX) injection  40 mg Subcutaneous Daily  . levothyroxine  75 mcg Oral Q0600  .  methylPREDNISolone (SOLU-MEDROL) injection  1 mg/kg Intravenous Q12H  . mirabegron ER  50 mg Oral Daily  . sodium chloride flush  3 mL Intravenous Q12H  . zinc sulfate  220 mg Oral Daily   Continuous Infusions: . remdesivir 100 mg in NS 100 mL 100 mg (07/29/20 1004)     LOS: 2 days   Time spent:  Azucena Fallen, DO [facility]  If 7PM-7AM, please contact night-coverage [email or web address]  07/29/2020, 3:11 PM

## 2020-07-30 DIAGNOSIS — U071 COVID-19: Secondary | ICD-10-CM | POA: Diagnosis not present

## 2020-07-30 DIAGNOSIS — J1282 Pneumonia due to coronavirus disease 2019: Secondary | ICD-10-CM | POA: Diagnosis not present

## 2020-07-30 LAB — C-REACTIVE PROTEIN: CRP: 6.6 mg/dL — ABNORMAL HIGH (ref <1.0)

## 2020-07-30 LAB — CBC WITH DIFFERENTIAL/PLATELET
Abs Immature Granulocytes: 0.2 10*3/uL — ABNORMAL HIGH (ref 0.00–0.07)
Basophils Absolute: 0.1 10*3/uL (ref 0.0–0.1)
Basophils Relative: 0 %
Eosinophils Absolute: 0 10*3/uL (ref 0.0–0.5)
Eosinophils Relative: 0 %
HCT: 35.3 % — ABNORMAL LOW (ref 36.0–46.0)
Hemoglobin: 11.7 g/dL — ABNORMAL LOW (ref 12.0–15.0)
Immature Granulocytes: 1 %
Lymphocytes Relative: 3 %
Lymphs Abs: 0.6 10*3/uL — ABNORMAL LOW (ref 0.7–4.0)
MCH: 30.3 pg (ref 26.0–34.0)
MCHC: 33.1 g/dL (ref 30.0–36.0)
MCV: 91.5 fL (ref 80.0–100.0)
Monocytes Absolute: 1.3 10*3/uL — ABNORMAL HIGH (ref 0.1–1.0)
Monocytes Relative: 6 %
Neutro Abs: 20.4 10*3/uL — ABNORMAL HIGH (ref 1.7–7.7)
Neutrophils Relative %: 90 %
Platelets: 255 10*3/uL (ref 150–400)
RBC: 3.86 MIL/uL — ABNORMAL LOW (ref 3.87–5.11)
RDW: 15 % (ref 11.5–15.5)
WBC: 21.4 10*3/uL — ABNORMAL HIGH (ref 4.0–10.5)
nRBC: 0 % (ref 0.0–0.2)

## 2020-07-30 LAB — COMPREHENSIVE METABOLIC PANEL
ALT: 28 U/L (ref 0–44)
AST: 44 U/L — ABNORMAL HIGH (ref 15–41)
Albumin: 2.3 g/dL — ABNORMAL LOW (ref 3.5–5.0)
Alkaline Phosphatase: 86 U/L (ref 38–126)
Anion gap: 11 (ref 5–15)
BUN: 54 mg/dL — ABNORMAL HIGH (ref 8–23)
CO2: 26 mmol/L (ref 22–32)
Calcium: 7.8 mg/dL — ABNORMAL LOW (ref 8.9–10.3)
Chloride: 100 mmol/L (ref 98–111)
Creatinine, Ser: 1.34 mg/dL — ABNORMAL HIGH (ref 0.44–1.00)
GFR, Estimated: 38 mL/min — ABNORMAL LOW (ref >60)
Glucose, Bld: 155 mg/dL — ABNORMAL HIGH (ref 70–99)
Potassium: 4.2 mmol/L (ref 3.5–5.1)
Sodium: 137 mmol/L (ref 135–145)
Total Bilirubin: 0.9 mg/dL (ref 0.3–1.2)
Total Protein: 5.1 g/dL — ABNORMAL LOW (ref 6.5–8.1)

## 2020-07-30 LAB — MAGNESIUM: Magnesium: 2.5 mg/dL — ABNORMAL HIGH (ref 1.7–2.4)

## 2020-07-30 LAB — D-DIMER, QUANTITATIVE: D-Dimer, Quant: 4.85 ug/mL-FEU — ABNORMAL HIGH (ref 0.00–0.50)

## 2020-07-30 LAB — FERRITIN: Ferritin: 1476 ng/mL — ABNORMAL HIGH (ref 11–307)

## 2020-07-30 LAB — PHOSPHORUS: Phosphorus: 4.3 mg/dL (ref 2.5–4.6)

## 2020-07-30 NOTE — Progress Notes (Signed)
PROGRESS NOTE    Teresa Ward  [id number] DOB: 02-24-1931 DOA: 07/26/2020 PCP: Gillian ScarceZanard, Robyn K, MD   Brief Narrative:  Teresa Ward is a 87 y.o. female with medical history significant of hyperlipidemia, hypertension, hypothyroidism, CVA, anxiety, anemia of chronic disease, overactive bladder who presents with cough and fever. Patient lives at home but has family around 24/7.  She was evaluated at [facility] - reported 4 days of cough/fever as high as 104 the day that she presented.  Patient is unvaccinated denies recent travel or sick contacts. In ED: Inflammatory markers; D-dimer 3.54, procalcitonin 0.3, LDH 170, TG 64, fibrinogen 744, CRP 13.8, ferritin 653.  Started on remdesivir and steroids and accepted for admission [facility] to [facility] but due to lack of beds this was switched to [facility]  Assessment & Plan:  Pneumonia due to COVID-19 Acute hypoxic respiratory failure, POA - Remdesivir/steroids initiated 07/27/2020 - Baricitinib initiated 07/27/2020 - Continue to wean oxygen as tolerated, early ambulation, proning, incentive spirometry and flutter recommended SpO2: 91 % O2 Flow Rate (L/min): 4 L/min Recent Labs    07/28/20 0616 07/29/20 0705 07/30/20 0433  DDIMER 6.12* 6.13* 4.85*  FERRITIN 1,237* 1,472* 1,476*  CRP 14.5* 9.6* 6.6*   Hypertension, essential - Borderline hypotension in the ED likely secondary to above - Hold home medications including valsartan and HCTZ - Restart home medications once indicated  AKI without history of CKD, resolving Hypokalemia, mild - Continue to increase p.o. intake as tolerated - Potassium currently within normal limits  Leukemoid reaction - Secondary to steroids  Overactive bladder, chronic history of - Continue Myrbetriq  History of CVA Baseline = moderately somnolent per family - Sleeps majority of the day, skips meals on occasion, liquids appear to be majority of her diet. She has to be awoken to  eat. She feeds her self mostly. 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)  Hypothyroidism - Continue home Synthroid 75  Anemia of chronic disease - Hemoglobin stable at 11 - Continue to monitor  DVT prophylaxis: Lovenox Code Status: DNR Family Communication: Daughter updated at length - hopeful to have patient home in the next few days  Status is: Inpatient  Dispo: The patient is from: Home              Anticipated d/c is to: To be determined              Anticipated d/c date is: 48 to 72 hours              Patient currently not medically stable for discharge  Consultants:   None  Procedures:   None   Subjective: Slept all day yesterday/overnight/this morning - family warned she will sleep if not stimulated. ROS limited due to baseline mental status but appears pleasant this am without overt complaints.  Objective: Vitals:   07/29/20 1416 07/29/20 2146 07/30/20 0541 07/30/20 1205  BP: (!) 102/58 (!) 108/55 107/65 110/63  Pulse: 76 86 90 80  Resp: 16 20 20 20   Temp: 98.3 F (36.8 C) 98.4 F (36.9 C) (!) 97.3 F (36.3 C) 98.9 F (37.2 C)  TempSrc:  Axillary Axillary   SpO2: 100% 93% 92% 91%  Weight:       No intake or output data in the 24 hours ending 07/30/20 1433 Filed Weights   07/26/20 2200 07/27/20 1931  Weight: 62.2 kg 59.9 kg    Examination:  General: Somewhat cachectic appearing female, somnolent but arouses easily. HEENT:  Normocephalic atraumatic.  Sclerae nonicteric, noninjected.  Extraocular movements intact bilaterally. Neck:  Without mass or deformity.  Trachea is midline. Lungs:  Clear to auscultate bilaterally without rhonchi, wheeze, or rales. Heart:  Regular rate and rhythm.  Without murmurs, rubs, or gallops. Abdomen:  Soft, nontender, nondistended.  Without guarding or rebound. Extremities: Without cyanosis, clubbing, edema, or obvious deformity. Vascular:   Dorsalis pedis and posterior tibial pulses palpable bilaterally. Skin:  Warm and dry, no erythema, no ulcerations.   Data Reviewed: I have personally reviewed following labs and imaging studies  CBC: Recent Labs  Lab 07/26/20 2100 07/28/20 0616 07/29/20 0705 07/30/20 0433  WBC 8.3 20.3* 15.9* 21.4*  NEUTROABS 6.5 17.9* 13.7* 20.4*  HGB 11.9* 12.8 11.0* 11.7*  HCT 37.0 37.9 32.0* 35.3*  MCV 93.7 91.8 89.4 91.5  PLT 218 286 247 255   Basic Metabolic Panel: Recent Labs  Lab 07/26/20 2100 07/27/20 0751 07/28/20 0616 07/29/20 0705 07/30/20 0433  NA 132* 134* 138 134* 137  K 3.1* 3.3* 3.8 3.9 4.2  CL 95* 101 102 100 100  CO2 24 24 23 23 26   GLUCOSE 140* 181* 146* 180* 155*  BUN 21 25* 32* 56* 54*  CREATININE 1.24* 0.92 1.10* 1.60* 1.34*  CALCIUM 8.7* 8.1* 8.2* 7.8* 7.8*  MG  --   --  2.1 2.3 2.5*  PHOS  --   --  3.7 3.8 4.3   GFR: CrCl cannot be calculated (Unknown ideal weight.). Liver Function Tests: Recent Labs  Lab 07/26/20 2100 07/28/20 0616 07/29/20 0705 07/30/20 0433  AST 48* 44* 40 44*  ALT 34 32 27 28  ALKPHOS 99 87 76 86  BILITOT 1.1 1.5* 0.8 0.9  PROT 6.5 5.7* 4.9* 5.1*  ALBUMIN 3.3* 2.4* 2.1* 2.3*   No results for input(s): LIPASE, AMYLASE in the last 168 hours. No results for input(s): AMMONIA in the last 168 hours. Coagulation Profile: No results for input(s): INR, PROTIME in the last 168 hours. Cardiac Enzymes: No results for input(s): CKTOTAL, CKMB, CKMBINDEX, TROPONINI in the last 168 hours. BNP (last 3 results) No results for input(s): PROBNP in the last 8760 hours. HbA1C: No results for input(s): HGBA1C in the last 72 hours. CBG: Recent Labs  Lab 07/27/20 1835  GLUCAP 139*   Lipid Profile: No results for input(s): CHOL, HDL, LDLCALC, TRIG, CHOLHDL, LDLDIRECT in the last 72 hours. Thyroid Function Tests: No results for input(s): TSH, T4TOTAL, FREET4, T3FREE, THYROIDAB in the last 72 hours. Anemia Panel: Recent Labs     07/29/20 0705 07/30/20 0433  FERRITIN 1,472* 1,476*   Sepsis Labs: Recent Labs  Lab 07/26/20 2035 07/26/20 2100 07/27/20 0025  PROCALCITON  --  0.30  --   LATICACIDVEN 1.6  --  1.4    Recent Results (from the past 240 hour(s))  Resp Panel by RT-PCR (Flu A&B, Covid) Nasopharyngeal Swab     Status: Abnormal   Collection Time: 07/26/20  8:32 PM   Specimen: Nasopharyngeal Swab; Nasopharyngeal(NP) swabs in vial transport medium  Result Value Ref Range Status   SARS Coronavirus 2 by RT PCR POSITIVE (A) NEGATIVE Final    Comment: RESULT CALLED TO, READ BACK BY AND VERIFIED WITH: LISA ADKINS RN AT 2149 ON 07/26/20 BY I.[email or web address] (NOTE) SARS-CoV-2 target nucleic acids are DETECTED.  The SARS-CoV-2 RNA is generally detectable in upper respiratory specimens during the acute phase of infection. Positive results are indicative of the presence of the  identified virus, but do not rule out bacterial infection or co-infection with other pathogens not detected by the test. Clinical correlation with patient history and other diagnostic information is necessary to determine patient infection status. The expected result is Negative.  Fact Sheet for Patients: BloggerCourse.com  Fact Sheet for Healthcare Providers: SeriousBroker.it  This test is not yet approved or cleared by the [place] FDA and  has been authorized for detection and/or diagnosis of SARS-CoV-2 by FDA under an Emergency Use Authorization (EUA).  This EUA will remain in effect (meaning this  test can be used) for the duration of  the COVID-19 declaration under Section 564(b)(1) of the Act, 21 U.S.C. section 360bbb-3(b)(1), unless the authorization is terminated or revoked sooner.     Influenza A by PCR NEGATIVE NEGATIVE Final   Influenza B by PCR NEGATIVE NEGATIVE Final    Comment: (NOTE) The Xpert Xpress SARS-CoV-2/FLU/RSV plus assay is intended as an aid in the diagnosis  of influenza from Nasopharyngeal swab specimens and should not be used as a sole basis for treatment. Nasal washings and aspirates are unacceptable for Xpert Xpress SARS-CoV-2/FLU/RSV testing.  Fact Sheet for Patients: BloggerCourse.com  Fact Sheet for Healthcare Providers: SeriousBroker.it  This test is not yet approved or cleared by the [place] FDA and has been authorized for detection and/or diagnosis of SARS-CoV-2 by FDA under an Emergency Use Authorization (EUA). This EUA will remain in effect (meaning this test can be used) for the duration of the COVID-19 declaration under Section 564(b)(1) of the Act, 21 U.S.C. section 360bbb-3(b)(1), unless the authorization is terminated or revoked.  Performed at [facility], [address] [place], [place] [address]   Blood Culture (routine x 2)     Status: None (Preliminary result)   Collection Time: 07/26/20  8:59 PM   Specimen: Left Antecubital; Blood  Result Value Ref Range Status   Specimen Description   Final    LEFT ANTECUBITAL Performed at [facility], [address] [place], [place] [address]    Special Requests   Final    BOTTLES DRAWN AEROBIC AND ANAEROBIC Blood Culture adequate volume Performed at [facility], [address] [place], [place] [address]    Culture   Final    NO GROWTH 3 DAYS Performed at [facility], [address], [place], [place] [address]    Report Status PENDING  Incomplete  Blood Culture (routine x 2)     Status: None (Preliminary result)   Collection Time: 07/26/20  9:10 PM   Specimen: Right Antecubital; Blood  Result Value Ref Range Status   Specimen Description   Final    RIGHT ANTECUBITAL Performed at [facility], [address] [place], [place] [address]    Special Requests   Final    BOTTLES DRAWN AEROBIC AND ANAEROBIC Blood Culture results may not be optimal due to an  inadequate volume of blood received in culture bottles Performed at [facility], [address] [place], [place] [address]    Culture   Final    NO GROWTH 3 DAYS Performed at [facility], [address] [place], [place] [address]    Report Status PENDING  Incomplete         Radiology Studies: No results found.  Scheduled Meds: . vitamin C  500 mg Oral Daily  . atorvastatin  80 mg Oral Daily  . baricitinib  1 mg Oral Daily  . DULoxetine  60 mg Oral Daily  . enoxaparin (LOVENOX) injection  40 mg Subcutaneous Daily  . lactose free nutrition  237 mL Oral TID WC  . levothyroxine  75 mcg Oral Q0600  . methylPREDNISolone (SOLU-MEDROL) injection  1 mg/kg Intravenous Q12H  . mirabegron ER  50 mg Oral Daily  . sodium chloride flush  3 mL Intravenous Q12H  . zinc sulfate  220 mg Oral Daily   Continuous Infusions:    LOS: 3 days   Time spent:  Azucena Fallen, DO [facility]  If 7PM-7AM, please contact night-coverage [email or web address]  07/30/2020, 2:33 PM

## 2020-07-30 NOTE — Plan of Care (Signed)
  Problem: Education: Goal: Knowledge of General Education information will improve Description Including pain rating scale, medication(s)/side effects and non-pharmacologic comfort measures Outcome: Progressing   

## 2020-07-30 NOTE — Progress Notes (Signed)
Physical Therapy Treatment Patient Details Name: Teresa Ward MRN: [id number] DOB: 03-Oct-1930 Today's Date: 07/30/2020    History of Present Illness Teresa Ward is a 87 y.o. female with medical history significant of hyperlipidemia, hypertension, hypothyroidism, CVA, anxiety, anemia of chronic disease, overactive bladder who presents with cough and fever. Pt found to be COVID+.    PT Comments    Pt supine on arrival with eyes open to voice. Pt with noted crusting of right eyelid and RN aware. Pt with limited visual tracking and focus and pt unable to state baseline vision or current deficits and difficult to communicate with due to pt poor vocal projection. Pt able to stand and transfer today with assist with significantly impaired balance and mod assist for all transfers. Pt on 3L O2 with SPO2 90% and HR 88. Will continue to follow to maximize mobility and function.     Follow Up Recommendations  SNF     Equipment Recommendations  Wheelchair (measurements PT);Wheelchair cushion (measurements PT)    Recommendations for Other Services       Precautions / Restrictions Precautions Precautions: Fall;Other (comment) Precaution Comments: monitor O2, very very soft spoken, ? vision Restrictions Weight Bearing Restrictions: No    Mobility  Bed Mobility Overal bed mobility: Needs Assistance Bed Mobility: Supine to Sit;Sit to Supine     Supine to sit: Min assist;HOB elevated Sit to supine: Mod assist   General bed mobility comments: HOB 40 degrees with min assist to fully pivot toward left side of bed. Mod assist to elevate legs and position in midline for return to bed  Transfers Overall transfer level: Needs assistance Equipment used: Rolling walker (2 wheeled) Transfers: Sit to/from [employer] Sit to Stand: Mod assist Stand pivot transfers: Mod assist       General transfer comment: mod assist to stand from bed and from [facility]. RW use for static standing  for pericare with continued mod assist for balance with posterior bias. Pt stood 4 x from bed and 1 x from [facility]. pivot Bed<>BSC. Physical assist to rise and cues for hand placement. pt able to hold RW and side step toward [facility]  Ambulation/Gait             General Gait Details: did not attempt   Stairs             Wheelchair Mobility    Modified Rankin (Stroke Patients Only)       Balance Overall balance assessment: Needs assistance   Sitting balance-Leahy Scale: Fair Sitting balance - Comments: pt able to sit BSC without UE support   Standing balance support: Bilateral upper extremity supported;Single extremity supported Standing balance-Leahy Scale: Poor Standing balance comment: single and bil UE support in standing                            Cognition Arousal/Alertness: Awake/alert Behavior During Therapy: Flat affect Overall Cognitive Status: No family/caregiver present to determine baseline cognitive functioning Area of Impairment: Orientation;Following commands;Safety/judgement                 Orientation Level: Disoriented to;Time;Situation Current Attention Level: Sustained   Following Commands: Follows one step commands with increased time;Follows one step commands inconsistently Safety/Judgement: Decreased awareness of safety;Decreased awareness of deficits   Problem Solving: Slow processing General Comments: pt extremely soft spoken and not change in volume with cues, pt maintains relatively blank stare and unclear if vision is impaired. Pt with LOB  and difficulty with tranfers but did not seem to recognize deficits or incontinence in standing      Exercises      General Comments        Pertinent Vitals/Pain Pain Assessment: No/denies pain    Home Living                      Prior Function            PT Goals (current goals can now be found in the care plan section) Progress towards PT goals: Progressing toward  goals    Frequency    Min 2X/week      PT Plan Current plan remains appropriate    Co-evaluation              AM-PAC PT "6 Clicks" Mobility   Outcome Measure  Help needed turning from your back to your side while in a flat bed without using bedrails?: A Lot Help needed moving from lying on your back to sitting on the side of a flat bed without using bedrails?: A Lot Help needed moving to and from a bed to a chair (including a wheelchair)?: A Lot Help needed standing up from a chair using your arms (e.g., wheelchair or bedside chair)?: A Lot Help needed to walk in hospital room?: Total Help needed climbing 3-5 steps with a railing? : Total 6 Click Score: 10    End of Session Equipment Utilized During Treatment: Oxygen Activity Tolerance: Patient limited by fatigue Patient left: in bed;with call bell/phone within reach;with bed alarm set Nurse Communication: Mobility status PT Visit Diagnosis: Other abnormalities of gait and mobility (R26.89);Muscle weakness (generalized) (M62.81)     Time: 6599-3570 PT Time Calculation (min) (ACUTE ONLY): 24 min  Charges:  $Therapeutic Activity: 23-37 mins                     Teresa Ward, PT Acute Rehabilitation Services Pager: [phone] Office: [phone]    Teresa Ward 07/30/2020, 1:38 PM

## 2020-07-31 DIAGNOSIS — J1282 Pneumonia due to coronavirus disease 2019: Secondary | ICD-10-CM | POA: Diagnosis not present

## 2020-07-31 DIAGNOSIS — U071 COVID-19: Secondary | ICD-10-CM | POA: Diagnosis not present

## 2020-07-31 LAB — COMPREHENSIVE METABOLIC PANEL
ALT: 25 U/L (ref 0–44)
AST: 38 U/L (ref 15–41)
Albumin: 2.2 g/dL — ABNORMAL LOW (ref 3.5–5.0)
Alkaline Phosphatase: 77 U/L (ref 38–126)
Anion gap: 10 (ref 5–15)
BUN: 64 mg/dL — ABNORMAL HIGH (ref 8–23)
CO2: 26 mmol/L (ref 22–32)
Calcium: 7.8 mg/dL — ABNORMAL LOW (ref 8.9–10.3)
Chloride: 103 mmol/L (ref 98–111)
Creatinine, Ser: 1.28 mg/dL — ABNORMAL HIGH (ref 0.44–1.00)
GFR, Estimated: 41 mL/min — ABNORMAL LOW (ref >60)
Glucose, Bld: 200 mg/dL — ABNORMAL HIGH (ref 70–99)
Potassium: 4.2 mmol/L (ref 3.5–5.1)
Sodium: 139 mmol/L (ref 135–145)
Total Bilirubin: 0.6 mg/dL (ref 0.3–1.2)
Total Protein: 4.8 g/dL — ABNORMAL LOW (ref 6.5–8.1)

## 2020-07-31 LAB — CBC WITH DIFFERENTIAL/PLATELET
Abs Immature Granulocytes: 0.15 10*3/uL — ABNORMAL HIGH (ref 0.00–0.07)
Basophils Absolute: 0 10*3/uL (ref 0.0–0.1)
Basophils Relative: 0 %
Eosinophils Absolute: 0 10*3/uL (ref 0.0–0.5)
Eosinophils Relative: 0 %
HCT: 31 % — ABNORMAL LOW (ref 36.0–46.0)
Hemoglobin: 10.3 g/dL — ABNORMAL LOW (ref 12.0–15.0)
Immature Granulocytes: 1 %
Lymphocytes Relative: 2 %
Lymphs Abs: 0.4 10*3/uL — ABNORMAL LOW (ref 0.7–4.0)
MCH: 30.4 pg (ref 26.0–34.0)
MCHC: 33.2 g/dL (ref 30.0–36.0)
MCV: 91.4 fL (ref 80.0–100.0)
Monocytes Absolute: 1 10*3/uL (ref 0.1–1.0)
Monocytes Relative: 6 %
Neutro Abs: 14.8 10*3/uL — ABNORMAL HIGH (ref 1.7–7.7)
Neutrophils Relative %: 91 %
Platelets: 231 10*3/uL (ref 150–400)
RBC: 3.39 MIL/uL — ABNORMAL LOW (ref 3.87–5.11)
RDW: 14.9 % (ref 11.5–15.5)
WBC: 16.4 10*3/uL — ABNORMAL HIGH (ref 4.0–10.5)
nRBC: 0 % (ref 0.0–0.2)

## 2020-07-31 LAB — MAGNESIUM: Magnesium: 2.7 mg/dL — ABNORMAL HIGH (ref 1.7–2.4)

## 2020-07-31 LAB — C-REACTIVE PROTEIN: CRP: 4 mg/dL — ABNORMAL HIGH (ref <1.0)

## 2020-07-31 LAB — PHOSPHORUS: Phosphorus: 2.9 mg/dL (ref 2.5–4.6)

## 2020-07-31 LAB — FERRITIN: Ferritin: 1215 ng/mL — ABNORMAL HIGH (ref 11–307)

## 2020-07-31 LAB — D-DIMER, QUANTITATIVE: D-Dimer, Quant: 3.87 ug/mL-FEU — ABNORMAL HIGH (ref 0.00–0.50)

## 2020-07-31 MED ORDER — BARICITINIB 2 MG PO TABS
2.0000 mg | ORAL_TABLET | Freq: Every day | ORAL | Status: DC
  Administered 2020-08-01 – 2020-08-03 (×3): 2 mg via ORAL
  Filled 2020-07-31 (×4): qty 1

## 2020-07-31 NOTE — TOC Initial Note (Addendum)
Transition of Care [facility]) - Initial/Assessment Note    Patient Details  Name: Teresa Ward MRN: [id number] Date of Birth: 11/04/1930  Transition of Care [facility]) CM/SW Contact:    Teresa Ward, LCSWA Phone Number: 07/31/2020, 3:08 PM  Clinical Narrative:                  CSW noted pt was disoriented, proceeded to call pt's dtr Teresa Ward to discuss PT recommendation. Dtr was advised that the recommendation was for SNF placement, and she was agreeable if it was at [facility]. She noted that if Teresa Ward cannot take, they are able to take her home with [facility]. The adult children made sure she has 24 hour supervision and they have DME at home including a Wheelchair, walker, hospital bed, elevated toilet and shower bench. Teresa Ward noted that she is interested in Teresa Ward. Pt has not been vaccinted, unable to do PASSR due to incorrect info in the system. Pt's SS# is on the FL2. CSW will follow up w/ Teresa Ward to have them review the referral. SW will continue to follow for DC planning.  Teresa Ward noted that they could not take patient, CSW updated case manager to change in disposition.  Expected Discharge Plan: Skilled Nursing Facility Barriers to Discharge: Continued Medical Work up,SNF Pending bed offer   Patient Goals and CMS Choice   CMS Medicare.gov Compare Post Acute Care list provided to:: Patient Represenative (must comment) (Daughter, Teresa Ward) Choice offered to / list presented to : Adult Children  Expected Discharge Plan and Services Expected Discharge Plan: Skilled Nursing Facility     Post Acute Care Choice: Skilled Nursing Facility Living arrangements for the past 2 months: Single Family Home                                      Prior Living Arrangements/Services Living arrangements for the past 2 months: Single Family Home Lives with:: Self,Adult Children Patient language and need for interpreter reviewed:: Yes Do you feel safe going back to the place where you live?: Yes       Need for Family Participation in Patient Care: Yes (Comment) Care giver support system in place?: Yes (comment) Current home services: DME Criminal Activity/Legal Involvement Pertinent to Current Situation/Hospitalization: No - Comment as needed  Activities of Daily Living      Permission Sought/Granted Permission sought to share information with : Family Supports Permission granted to share information with : Yes, Verbal Permission Granted  Share Information with NAME: Teresa Ward     Permission granted to share info w Relationship: Daughter  Permission granted to share info w Contact Information: [phone]  Emotional Assessment Appearance:: Other (Comment Required (Unable to Assess) Attitude/Demeanor/Rapport: Unable to Assess Affect (typically observed): Unable to Assess Orientation: : Oriented to Self Alcohol / Substance Use: Not Applicable Psych Involvement: No (comment)  Admission diagnosis:  Acute respiratory failure with hypoxia (HCC) [J96.01] COVID [U07.1] COVID-19 virus infection [U07.1] COVID-19 [U07.1] Patient Active Problem List   Diagnosis Date Noted   COVID-19 virus infection 07/27/2020   Pneumonia due to COVID-19 virus 07/27/2020   Hypothyroid 07/27/2020   HTN (hypertension) 07/27/2020   HLD (hyperlipidemia) 07/27/2020   History of CVA (cerebrovascular accident) 07/27/2020   Anxiety 07/27/2020   Anemia of chronic disease 07/27/2020   Overactive bladder 07/27/2020   PCP:  Teresa Scarce, MD Pharmacy:   [id number] - [place], [place] -  [address] [place] [address] Phone: [phone] Fax: [phone]     Social Determinants of Health (SDOH) Interventions    Readmission Risk Interventions No flowsheet data found.

## 2020-07-31 NOTE — [place] FL2 (Signed)
[place] MEDICAID FL2 LEVEL OF CARE SCREENING TOOL     IDENTIFICATION  Patient Name: Teresa Ward Birthdate: 1931-06-29 Sex: female Admission Date (Current Location): 07/26/2020  [facility] and [place] Number:  Producer, television/film/video and Address:  [facility], [address] [place], [place] [address]      Provider Number: [id number]  Attending Physician Name and Address:  Azucena Fallen, MD  Relative Name and Phone Number:  [name], [phone]    Current Level of Care: Hospital Recommended Level of Care: Skilled Nursing Facility Prior Approval Number:    Date Approved/Denied:   PASRR Number:    Discharge Plan: SNF    Current Diagnoses: Patient Active Problem List   Diagnosis Date Noted  . COVID-19 virus infection 07/27/2020  . Pneumonia due to COVID-19 virus 07/27/2020  . Hypothyroid 07/27/2020  . HTN (hypertension) 07/27/2020  . HLD (hyperlipidemia) 07/27/2020  . History of CVA (cerebrovascular accident) 07/27/2020  . Anxiety 07/27/2020  . Anemia of chronic disease 07/27/2020  . Overactive bladder 07/27/2020    Orientation RESPIRATION BLADDER Height & Weight     Self  O2 ([place] 3) External catheter Weight: 132 lb 0.9 oz (59.9 kg) Height:     BEHAVIORAL SYMPTOMS/MOOD NEUROLOGICAL BOWEL NUTRITION STATUS      Incontinent Diet (See DC summary)  AMBULATORY STATUS COMMUNICATION OF NEEDS Skin   Total Care Verbally Normal                       Personal Care Assistance Level of Assistance  Bathing,Feeding,Dressing Bathing Assistance: Maximum assistance Feeding assistance: Limited assistance Dressing Assistance: Maximum assistance     Functional Limitations Info  Sight,Hearing,Speech Sight Info: Adequate Hearing Info: Adequate Speech Info: Adequate    SPECIAL CARE FACTORS FREQUENCY  PT (By licensed PT),OT (By licensed OT)     PT Frequency: 5x week OT Frequency: 5x week            Contractures Contractures  Info: Not present    Additional Factors Info  Code Status,Allergies,Psychotropic Code Status Info: DNR Allergies Info: NKA Psychotropic Info: Cymbalta         Current Medications (07/31/2020):  This is the current hospital active medication list Current Facility-Administered Medications  Medication Dose Route Frequency Provider Last Rate Last Admin  . acetaminophen (TYLENOL) tablet 650 mg  650 mg Oral Q6H PRN Synetta Fail, MD   650 mg at 07/28/20 2112  . ascorbic acid (VITAMIN C) tablet 500 mg  500 mg Oral Daily Synetta Fail, MD   500 mg at 07/30/20 1059  . atorvastatin (LIPITOR) tablet 80 mg  80 mg Oral Daily Synetta Fail, MD   80 mg at 07/30/20 1059  . [START ON 08/01/2020] baricitinib (OLUMIANT) tablet 2 mg  2 mg Oral Daily Ike Bene, RPH      . chlorpheniramine-HYDROcodone (TUSSIONEX) 10-8 MG/5ML suspension 5 mL  5 mL Oral Q12H PRN Synetta Fail, MD      . DULoxetine (CYMBALTA) DR capsule 60 mg  60 mg Oral Daily Synetta Fail, MD   60 mg at 07/30/20 1059  . enoxaparin (LOVENOX) injection 40 mg  40 mg Subcutaneous Daily Synetta Fail, MD   40 mg at 07/31/20 0850  . guaiFENesin-dextromethorphan (ROBITUSSIN DM) 100-10 MG/5ML syrup 10 mL  10 mL Oral Q4H PRN Synetta Fail, MD      . Ipratropium-Albuterol (COMBIVENT) respimat 1 puff  1 puff  Inhalation Q6H PRN Synetta Fail, MD      . lactose free nutrition (BOOST PLUS) liquid 237 mL  237 mL Oral TID WC Azucena Fallen, MD   237 mL at 07/31/20 0545  . levothyroxine (SYNTHROID) tablet 75 mcg  75 mcg Oral Q0600 Synetta Fail, MD   75 mcg at 07/31/20 0524  . methylPREDNISolone sodium succinate (SOLU-MEDROL) 125 mg/2 mL injection 62.5 mg  1 mg/kg Intravenous Q12H Synetta Fail, MD   6.25 mg at 07/31/20 0849  . mirabegron ER (MYRBETRIQ) tablet 50 mg  50 mg Oral Daily Synetta Fail, MD   50 mg at 07/30/20 1059  . polyethylene glycol (MIRALAX / GLYCOLAX) packet 17 g  17 g  Oral Daily PRN Synetta Fail, MD      . sodium chloride flush (NS) 0.9 % injection 3 mL  3 mL Intravenous Q12H Synetta Fail, MD   3 mL at 07/30/20 2107  . zinc sulfate capsule 220 mg  220 mg Oral Daily Synetta Fail, MD   220 mg at 07/30/20 1059     Discharge Medications: Please see discharge summary for a list of discharge medications.  Relevant Imaging Results:  Relevant Lab Results:   Additional Information SS# [id number]  Carley Hammed, LCSWA

## 2020-07-31 NOTE — Progress Notes (Signed)
PROGRESS NOTE    Teresa Ward  SPQ:[id number] DOB: May 30, 1931 DOA: 07/26/2020 PCP: Gillian Scarce, MD   Brief Narrative:  Teresa Ward is a 87 y.o. female with medical history significant of hyperlipidemia, hypertension, hypothyroidism, CVA, anxiety, anemia of chronic disease, overactive bladder who presents with cough and fever. Patient lives at home but has family around 24/7.  She was evaluated at [facility] - reported 4 days of cough/fever as high as 104 the day that she presented.  Patient is unvaccinated denies recent travel or sick contacts. In ED: Inflammatory markers; D-dimer 3.54, procalcitonin 0.3, LDH 170, TG 64, fibrinogen 744, CRP 13.8, ferritin 653.  Started on remdesivir and steroids and accepted for admission initially to [facility] but due to lack of beds this was switched to [facility]  Assessment & Plan:  Pneumonia due to COVID-19 Acute hypoxic respiratory failure, POA - Remdesivir/steroids initiated 07/26/2020 - Baricitinib initiated 07/28/2020 - Continue to wean oxygen as tolerated, early ambulation, proning, incentive spirometry and flutter recommended SpO2: 91 % O2 Flow Rate (L/min): 3 L/min Recent Labs    07/29/20 0705 07/30/20 0433 07/31/20 0456  DDIMER 6.13* 4.85* 3.87*  FERRITIN 1,472* 1,476* 1,215*  CRP 9.6* 6.6* 4.0*   Hypertension, essential - Borderline hypotension in the ED likely secondary to above - Hold home medications including valsartan and HCTZ - Restart home medications once indicated  AKI without history of CKD, resolving Hypokalemia, mild - Continue to increase p.o. intake as tolerated - Potassium currently within normal limits  Leukemoid reaction - Secondary to steroids  Overactive bladder, chronic history of - Continue Myrbetriq  History of CVA Baseline = moderately somnolent per family; moderate(? Max) assist to get out of chair/bed but able to ambulate with assistance - Sleeps majority of the day, skips  meals on occasion, liquids appear to be majority of her diet. She has to be awoken to eat. She feeds her self mostly. 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)  Hypothyroidism - Continue home Synthroid 75  Anemia of chronic disease - Hemoglobin stable at 11 - Continue to monitor  DVT prophylaxis: Lovenox Code Status: DNR Family Communication: Daughter updated at length - hopeful to have patient home in the next few days  Status is: Inpatient  Dispo: The patient is from: Home              Anticipated d/c is to: To be determined              Anticipated d/c date is: 24-48h              Patient currently not medically stable for discharge - pending oxygen weaning and inflammatory markers. Already back to baseline mental status/activity level (profoundly limited).  Consultants:   None  Procedures:   None   Subjective: Continues to refuse to participate with PT/ambulation. Family warned she will sleep if not stimulated. ROS limited due to baseline mental status but appears pleasant this am without overt complaints.  Objective: Vitals:   07/30/20 1205 07/30/20 2057 07/31/20 0523 07/31/20 0845  BP: 110/63 108/62 (!) 91/54 (!) 96/59  Pulse: 80 83 66 68  Resp: 20 16 16    Temp: 98.9 F (37.2 C) 98.8 F (37.1 C) 98.6 F (37 C) (!) 97.5 F (36.4 C)  TempSrc:  Oral  Axillary  SpO2: 91% 95% 91% 91%  Weight:        Intake/Output  Summary (Last 24 hours) at 07/31/2020 1453 Last data filed at 07/31/2020 0426 Gross per 24 hour  Intake --  Output 550 ml  Net -550 ml   Filed Weights   07/26/20 2200 07/27/20 1931  Weight: 62.2 kg 59.9 kg    Examination:  General: Somewhat cachectic appearing female, somnolent but arouses easily. HEENT:  Normocephalic atraumatic.  Sclerae nonicteric, noninjected.  Extraocular movements intact bilaterally. Neck:  Without mass or deformity.  Trachea is  midline. Lungs:  Clear to auscultate bilaterally without rhonchi, wheeze, or rales. Heart:  Regular rate and rhythm.  Without murmurs, rubs, or gallops. Abdomen:  Soft, nontender, nondistended.  Without guarding or rebound. Extremities: Without cyanosis, clubbing, edema, or obvious deformity. Vascular:  Dorsalis pedis and posterior tibial pulses palpable bilaterally. Skin:  Warm and dry, no erythema, no ulcerations.  Data Reviewed: I have personally reviewed following labs and imaging studies  CBC: Recent Labs  Lab 07/26/20 2100 07/28/20 0616 07/29/20 0705 07/30/20 0433 07/31/20 0456  WBC 8.3 20.3* 15.9* 21.4* 16.4*  NEUTROABS 6.5 17.9* 13.7* 20.4* 14.8*  HGB 11.9* 12.8 11.0* 11.7* 10.3*  HCT 37.0 37.9 32.0* 35.3* 31.0*  MCV 93.7 91.8 89.4 91.5 91.4  PLT 218 286 247 255 231   Basic Metabolic Panel: Recent Labs  Lab 07/27/20 0751 07/28/20 0616 07/29/20 0705 07/30/20 0433 07/31/20 0456  NA 134* 138 134* 137 139  K 3.3* 3.8 3.9 4.2 4.2  CL 101 102 100 100 103  CO2 24 23 23 26 26   GLUCOSE 181* 146* 180* 155* 200*  BUN 25* 32* 56* 54* 64*  CREATININE 0.92 1.10* 1.60* 1.34* 1.28*  CALCIUM 8.1* 8.2* 7.8* 7.8* 7.8*  MG  --  2.1 2.3 2.5* 2.7*  PHOS  --  3.7 3.8 4.3 2.9   GFR: CrCl cannot be calculated (Unknown ideal weight.). Liver Function Tests: Recent Labs  Lab 07/26/20 2100 07/28/20 0616 07/29/20 0705 07/30/20 0433 07/31/20 0456  AST 48* 44* 40 44* 38  ALT 34 32 27 28 25   ALKPHOS 99 87 76 86 77  BILITOT 1.1 1.5* 0.8 0.9 0.6  PROT 6.5 5.7* 4.9* 5.1* 4.8*  ALBUMIN 3.3* 2.4* 2.1* 2.3* 2.2*   No results for input(s): LIPASE, AMYLASE in the last 168 hours. No results for input(s): AMMONIA in the last 168 hours. Coagulation Profile: No results for input(s): INR, PROTIME in the last 168 hours. Cardiac Enzymes: No results for input(s): CKTOTAL, CKMB, CKMBINDEX, TROPONINI in the last 168 hours. BNP (last 3 results) No results for input(s): PROBNP in the last 8760  hours. HbA1C: No results for input(s): HGBA1C in the last 72 hours. CBG: Recent Labs  Lab 07/27/20 1835  GLUCAP 139*   Lipid Profile: No results for input(s): CHOL, HDL, LDLCALC, TRIG, CHOLHDL, LDLDIRECT in the last 72 hours. Thyroid Function Tests: No results for input(s): TSH, T4TOTAL, FREET4, T3FREE, THYROIDAB in the last 72 hours. Anemia Panel: Recent Labs    07/30/20 0433 07/31/20 0456  FERRITIN 1,476* 1,215*   Sepsis Labs: Recent Labs  Lab 07/26/20 2035 07/26/20 2100 07/27/20 0025  PROCALCITON  --  0.30  --   LATICACIDVEN 1.6  --  1.4    Recent Results (from the past 240 hour(s))  Resp Panel by RT-PCR (Flu A&B, Covid) Nasopharyngeal Swab     Status: Abnormal   Collection Time: 07/26/20  8:32 PM   Specimen: Nasopharyngeal Swab; Nasopharyngeal(NP) swabs in vial transport medium  Result Value Ref Range Status   SARS Coronavirus 2 by RT  PCR POSITIVE (A) NEGATIVE Final    Comment: RESULT CALLED TO, READ BACK BY AND VERIFIED WITH: LISA ADKINS RN AT 2149 ON 07/26/20 BY I.SUGUT (NOTE) SARS-CoV-2 target nucleic acids are DETECTED.  The SARS-CoV-2 RNA is generally detectable in upper respiratory specimens during the acute phase of infection. Positive results are indicative of the presence of the identified virus, but do not rule out bacterial infection or co-infection with other pathogens not detected by the test. Clinical correlation with patient history and other diagnostic information is necessary to determine patient infection status. The expected result is Negative.  Fact Sheet for Patients: BloggerCourse.com  Fact Sheet for Healthcare Providers: SeriousBroker.it  This test is not yet approved or cleared by the [place] FDA and  has been authorized for detection and/or diagnosis of SARS-CoV-2 by FDA under an Emergency Use Authorization (EUA).  This EUA will remain in effect (meaning this  test can be used)  for the duration of  the COVID-19 declaration under Section 564(b)(1) of the Act, 21 U.S.C. section 360bbb-3(b)(1), unless the authorization is terminated or revoked sooner.     Influenza A by PCR NEGATIVE NEGATIVE Final   Influenza B by PCR NEGATIVE NEGATIVE Final    Comment: (NOTE) The Xpert Xpress SARS-CoV-2/FLU/RSV plus assay is intended as an aid in the diagnosis of influenza from Nasopharyngeal swab specimens and should not be used as a sole basis for treatment. Nasal washings and aspirates are unacceptable for Xpert Xpress SARS-CoV-2/FLU/RSV testing.  Fact Sheet for Patients: BloggerCourse.com  Fact Sheet for Healthcare Providers: SeriousBroker.it  This test is not yet approved or cleared by the [place] FDA and has been authorized for detection and/or diagnosis of SARS-CoV-2 by FDA under an Emergency Use Authorization (EUA). This EUA will remain in effect (meaning this test can be used) for the duration of the COVID-19 declaration under Section 564(b)(1) of the Act, 21 U.S.C. section 360bbb-3(b)(1), unless the authorization is terminated or revoked.  Performed at [facility], [address] [place], [place] [address]   Blood Culture (routine x 2)     Status: None (Preliminary result)   Collection Time: 07/26/20  8:59 PM   Specimen: Left Antecubital; Blood  Result Value Ref Range Status   Specimen Description   Final    LEFT ANTECUBITAL Performed at [facility], [address] [place], [place] [address]    Special Requests   Final    BOTTLES DRAWN AEROBIC AND ANAEROBIC Blood Culture adequate volume Performed at [facility], [address] [place], [place] [address]    Culture   Final    NO GROWTH 3 DAYS Performed at [facility], [address], [place], [place] [address]    Report Status PENDING  Incomplete  Blood Culture (routine x 2)     Status: None  (Preliminary result)   Collection Time: 07/26/20  9:10 PM   Specimen: Right Antecubital; Blood  Result Value Ref Range Status   Specimen Description   Final    RIGHT ANTECUBITAL Performed at [facility] [address] [place], [place] [address]    Special Requests   Final    BOTTLES DRAWN AEROBIC AND ANAEROBIC Blood Culture results may not be optimal due to an inadequate volume of blood received in culture bottles Performed at [facility], [address] [place], [place] [address]    Culture   Final    NO  GROWTH 3 DAYS Performed at [facility], [address], [place], [place] [address]    Report Status PENDING  Incomplete         Radiology Studies: No results found.  Scheduled Meds: . vitamin C  500 mg Oral Daily  . atorvastatin  80 mg Oral Daily  . [START ON 08/01/2020] baricitinib  2 mg Oral Daily  . DULoxetine  60 mg Oral Daily  . enoxaparin (LOVENOX) injection  40 mg Subcutaneous Daily  . lactose free nutrition  237 mL Oral TID WC  . levothyroxine  75 mcg Oral Q0600  . methylPREDNISolone (SOLU-MEDROL) injection  1 mg/kg Intravenous Q12H  . mirabegron ER  50 mg Oral Daily  . sodium chloride flush  3 mL Intravenous Q12H  . zinc sulfate  220 mg Oral Daily   Continuous Infusions:    LOS: 4 days   Time spent:  Azucena Fallen, DO [facility]  If 7PM-7AM, please contact night-coverage [email or web address]  07/31/2020, 2:53 PM

## 2020-07-31 NOTE — Care Management (Signed)
HH services set up w [facility].

## 2020-08-01 ENCOUNTER — Encounter (HOSPITAL_COMMUNITY): Payer: Self-pay | Admitting: Internal Medicine

## 2020-08-01 DIAGNOSIS — U071 COVID-19: Secondary | ICD-10-CM | POA: Diagnosis not present

## 2020-08-01 DIAGNOSIS — J1282 Pneumonia due to coronavirus disease 2019: Secondary | ICD-10-CM | POA: Diagnosis not present

## 2020-08-01 LAB — CBC WITH DIFFERENTIAL/PLATELET
Abs Immature Granulocytes: 0.23 10*3/uL — ABNORMAL HIGH (ref 0.00–0.07)
Basophils Absolute: 0 10*3/uL (ref 0.0–0.1)
Basophils Relative: 0 %
Eosinophils Absolute: 0 10*3/uL (ref 0.0–0.5)
Eosinophils Relative: 0 %
HCT: 31.1 % — ABNORMAL LOW (ref 36.0–46.0)
Hemoglobin: 10.2 g/dL — ABNORMAL LOW (ref 12.0–15.0)
Immature Granulocytes: 2 %
Lymphocytes Relative: 3 %
Lymphs Abs: 0.4 10*3/uL — ABNORMAL LOW (ref 0.7–4.0)
MCH: 30.1 pg (ref 26.0–34.0)
MCHC: 32.8 g/dL (ref 30.0–36.0)
MCV: 91.7 fL (ref 80.0–100.0)
Monocytes Absolute: 0.8 10*3/uL (ref 0.1–1.0)
Monocytes Relative: 5 %
Neutro Abs: 14.3 10*3/uL — ABNORMAL HIGH (ref 1.7–7.7)
Neutrophils Relative %: 90 %
Platelets: 221 10*3/uL (ref 150–400)
RBC: 3.39 MIL/uL — ABNORMAL LOW (ref 3.87–5.11)
RDW: 14.9 % (ref 11.5–15.5)
WBC: 15.9 10*3/uL — ABNORMAL HIGH (ref 4.0–10.5)
nRBC: 0 % (ref 0.0–0.2)

## 2020-08-01 LAB — COMPREHENSIVE METABOLIC PANEL
ALT: 25 U/L (ref 0–44)
AST: 33 U/L (ref 15–41)
Albumin: 2.2 g/dL — ABNORMAL LOW (ref 3.5–5.0)
Alkaline Phosphatase: 77 U/L (ref 38–126)
Anion gap: 7 (ref 5–15)
BUN: 65 mg/dL — ABNORMAL HIGH (ref 8–23)
CO2: 28 mmol/L (ref 22–32)
Calcium: 8.1 mg/dL — ABNORMAL LOW (ref 8.9–10.3)
Chloride: 104 mmol/L (ref 98–111)
Creatinine, Ser: 1.23 mg/dL — ABNORMAL HIGH (ref 0.44–1.00)
GFR, Estimated: 43 mL/min — ABNORMAL LOW (ref >60)
Glucose, Bld: 175 mg/dL — ABNORMAL HIGH (ref 70–99)
Potassium: 4.6 mmol/L (ref 3.5–5.1)
Sodium: 139 mmol/L (ref 135–145)
Total Bilirubin: 0.9 mg/dL (ref 0.3–1.2)
Total Protein: 5 g/dL — ABNORMAL LOW (ref 6.5–8.1)

## 2020-08-01 LAB — CULTURE, BLOOD (ROUTINE X 2)
Culture: NO GROWTH
Culture: NO GROWTH
Special Requests: ADEQUATE

## 2020-08-01 LAB — C-REACTIVE PROTEIN: CRP: 3.1 mg/dL — ABNORMAL HIGH (ref <1.0)

## 2020-08-01 LAB — D-DIMER, QUANTITATIVE: D-Dimer, Quant: 3.08 ug/mL-FEU — ABNORMAL HIGH (ref 0.00–0.50)

## 2020-08-01 NOTE — Progress Notes (Signed)
PROGRESS NOTE    Teresa Ward  ENI:[id number] DOB: Mar 20, 1931 DOA: 07/26/2020 PCP: Gillian Scarce, MD   Brief Narrative:   Teresa Ward a 87 y.o.femalewith medical history significant ofhyperlipidemia, hypertension, hypothyroidism, CVA, anxiety, anemia of chronic disease, overactive bladder who presents with cough and fever. Patient lives at home but has family around 24/7. She was evaluated at [facility] - reported 4 days of cough/fever as high as 104 the day that she presented. Patient is unvaccinated denies recent travel or sick contacts. In ED: Inflammatory markers; D-dimer 3.54, procalcitonin 0.3, LDH 170, TG 64, fibrinogen 744, CRP 13.8, ferritin 653. Started on remdesivir and steroids and accepted for admission initially to [facility] but due to lack of beds this was switched to [facility]  08/01/20: BP still soft. She remains on 3L [place]. Will ask TOC about the possibility of sending her home with O2. SLP has seen and rec fine-chop, honey thick diet.   Assessment & Plan: Pneumonia due to COVID-19 Acute hypoxic respiratory failure, POA     - remdes completed; continue steroids, baricitinib     - ambulation, proning, IS, FV     - wean O2 as able; on 3L during interview  Hypertension, essential     - BP remains borderline low, continue holding home meds  AKI without history of CKD     - SCr down to 1.23 today; watch nephrotoxins     - baseline ~0.9? Follow  Hypokalemia     - resolved  Leukemoid reaction     - secondary to steroids; follow  Chronic overactive bladder     -continue Myrbetriq  History of CVA     - Baseline = moderately somnolent per family; moderate(?) Max assist to get out of chair/bed but able to ambulate with assistance     - Sleeps majority of the day, skips meals on occasion, liquids appear to be majority of her diet.She has to be awoken to eat. She feeds her self mostly.     - Needs ambulation help at all times due to balance  issues (chronic)     - Very limited vision (chronic diplopia with CVA)  Hypothyroidism     - continue home synthroid  Anemia of chronic disease     - Hgb 10.2 this AM, no signs of bleed, monitor  DVT prophylaxis: lovenox Code Status: DNR Family Communication: None at bedside.   Status is: Inpatient  Remains inpatient appropriate because:Inpatient level of care appropriate due to severity of illness   Dispo: The patient is from: Home              Anticipated d/c is to: TBD              Anticipated d/c date is: 2 days              Patient currently is not medically stable to d/c.   Difficult to place patient No  Consultants:   None  Procedures:   None  Antimicrobials:  . Remdesivir   Subjective: No acute events ON  Objective: Vitals:   07/31/20 0845 07/31/20 1505 07/31/20 2055 08/01/20 0437  BP: (!) 96/59 (!) 95/59 106/60 (!) 91/57  Pulse: 68  79 62  Resp:  16 18 20   Temp: (!) 97.5 F (36.4 C) 97.9 F (36.6 C) 98.2 F (36.8 C) 98.2 F (36.8 C)  TempSrc: Axillary Axillary Oral Oral  SpO2: 91% 90% 98% 94%  Weight:  No intake or output data in the 24 hours ending 08/01/20 1016 Filed Weights   07/26/20 2200 07/27/20 1931  Weight: 62.2 kg 59.9 kg    Examination:  General: 87 y.o. frail appearing female resting in bed in NAD Eyes: PERRL, normal sclera ENMT: Nares patent w/o discharge, orophaynx clear, dentition normal, ears w/o discharge/lesions/ulcers Neck: Supple, trachea midline Cardiovascular: RRR, +S1, S2, no m/g/r, equal pulses throughout Respiratory: CTABL, no w/r/r, normal WOB GI: BS+, NDNT, no masses noted, no organomegaly noted MSK: No e/c/c Skin: No rashes, bruises, ulcerations noted Psyc: flat affect, calm/cooperative   Data Reviewed: I have personally reviewed following labs and imaging studies.  CBC: Recent Labs  Lab 07/28/20 0616 07/29/20 0705 07/30/20 0433 07/31/20 0456 08/01/20 0336  WBC 20.3* 15.9* 21.4* 16.4* 15.9*   NEUTROABS 17.9* 13.7* 20.4* 14.8* 14.3*  HGB 12.8 11.0* 11.7* 10.3* 10.2*  HCT 37.9 32.0* 35.3* 31.0* 31.1*  MCV 91.8 89.4 91.5 91.4 91.7  PLT 286 247 255 231 221   Basic Metabolic Panel: Recent Labs  Lab 07/28/20 0616 07/29/20 0705 07/30/20 0433 07/31/20 0456 08/01/20 0336  NA 138 134* 137 139 139  K 3.8 3.9 4.2 4.2 4.6  CL 102 100 100 103 104  CO2 23 23 26 26 28   GLUCOSE 146* 180* 155* 200* 175*  BUN 32* 56* 54* 64* 65*  CREATININE 1.10* 1.60* 1.34* 1.28* 1.23*  CALCIUM 8.2* 7.8* 7.8* 7.8* 8.1*  MG 2.1 2.3 2.5* 2.7*  --   PHOS 3.7 3.8 4.3 2.9  --    GFR: CrCl cannot be calculated (Unknown ideal weight.). Liver Function Tests: Recent Labs  Lab 07/28/20 0616 07/29/20 0705 07/30/20 0433 07/31/20 0456 08/01/20 0336  AST 44* 40 44* 38 33  ALT 32 27 28 25 25   ALKPHOS 87 76 86 77 77  BILITOT 1.5* 0.8 0.9 0.6 0.9  PROT 5.7* 4.9* 5.1* 4.8* 5.0*  ALBUMIN 2.4* 2.1* 2.3* 2.2* 2.2*   No results for input(s): LIPASE, AMYLASE in the last 168 hours. No results for input(s): AMMONIA in the last 168 hours. Coagulation Profile: No results for input(s): INR, PROTIME in the last 168 hours. Cardiac Enzymes: No results for input(s): CKTOTAL, CKMB, CKMBINDEX, TROPONINI in the last 168 hours. BNP (last 3 results) No results for input(s): PROBNP in the last 8760 hours. HbA1C: No results for input(s): HGBA1C in the last 72 hours. CBG: Recent Labs  Lab 07/27/20 1835  GLUCAP 139*   Lipid Profile: No results for input(s): CHOL, HDL, LDLCALC, TRIG, CHOLHDL, LDLDIRECT in the last 72 hours. Thyroid Function Tests: No results for input(s): TSH, T4TOTAL, FREET4, T3FREE, THYROIDAB in the last 72 hours. Anemia Panel: Recent Labs    07/30/20 0433 07/31/20 0456  FERRITIN 1,476* 1,215*   Sepsis Labs: Recent Labs  Lab 07/26/20 2035 07/26/20 2100 07/27/20 0025  PROCALCITON  --  0.30  --   LATICACIDVEN 1.6  --  1.4    Recent Results (from the past 240 hour(s))  Resp Panel by  RT-PCR (Flu A&B, Covid) Nasopharyngeal Swab     Status: Abnormal   Collection Time: 07/26/20  8:32 PM   Specimen: Nasopharyngeal Swab; Nasopharyngeal(NP) swabs in vial transport medium  Result Value Ref Range Status   SARS Coronavirus 2 by RT PCR POSITIVE (A) NEGATIVE Final    Comment: RESULT CALLED TO, READ BACK BY AND VERIFIED WITH: LISA ADKINS RN AT 2149 ON 07/26/20 BY I.SUGUT (NOTE) SARS-CoV-2 target nucleic acids are DETECTED.  The SARS-CoV-2 RNA is generally detectable  in upper respiratory specimens during the acute phase of infection. Positive results are indicative of the presence of the identified virus, but do not rule out bacterial infection or co-infection with other pathogens not detected by the test. Clinical correlation with patient history and other diagnostic information is necessary to determine patient infection status. The expected result is Negative.  Fact Sheet for Patients: BloggerCourse.com  Fact Sheet for Healthcare Providers: SeriousBroker.it  This test is not yet approved or cleared by the [place] FDA and  has been authorized for detection and/or diagnosis of SARS-CoV-2 by FDA under an Emergency Use Authorization (EUA).  This EUA will remain in effect (meaning this  test can be used) for the duration of  the COVID-19 declaration under Section 564(b)(1) of the Act, 21 U.S.C. section 360bbb-3(b)(1), unless the authorization is terminated or revoked sooner.     Influenza A by PCR NEGATIVE NEGATIVE Final   Influenza B by PCR NEGATIVE NEGATIVE Final    Comment: (NOTE) The Xpert Xpress SARS-CoV-2/FLU/RSV plus assay is intended as an aid in the diagnosis of influenza from Nasopharyngeal swab specimens and should not be used as a sole basis for treatment. Nasal washings and aspirates are unacceptable for Xpert Xpress SARS-CoV-2/FLU/RSV testing.  Fact Sheet for  Patients: BloggerCourse.com  Fact Sheet for Healthcare Providers: SeriousBroker.it  This test is not yet approved or cleared by the [place] FDA and has been authorized for detection and/or diagnosis of SARS-CoV-2 by FDA under an Emergency Use Authorization (EUA). This EUA will remain in effect (meaning this test can be used) for the duration of the COVID-19 declaration under Section 564(b)(1) of the Act, 21 U.S.C. section 360bbb-3(b)(1), unless the authorization is terminated or revoked.  Performed at [facility], [address] [place], [place] [address]   Blood Culture (routine x 2)     Status: None (Preliminary result)   Collection Time: 07/26/20  8:59 PM   Specimen: Left Antecubital; Blood  Result Value Ref Range Status   Specimen Description   Final    LEFT ANTECUBITAL Performed at [facility], [address] [place], [place] [address]    Special Requests   Final    BOTTLES DRAWN AEROBIC AND ANAEROBIC Blood Culture adequate volume Performed at [facility], [address] [place], [place] [address]    Culture   Final    NO GROWTH 4 DAYS Performed at [facility], [address], [place], [place] [address]    Report Status PENDING  Incomplete  Blood Culture (routine x 2)     Status: None (Preliminary result)   Collection Time: 07/26/20  9:10 PM   Specimen: Right Antecubital; Blood  Result Value Ref Range Status   Specimen Description   Final    RIGHT ANTECUBITAL Performed at [facility], [address] [place], [place] [address]    Special Requests   Final    BOTTLES DRAWN AEROBIC AND ANAEROBIC Blood Culture results may not be optimal due to an inadequate volume of blood received in culture bottles Performed at [facility], [address] [place], [place] [address]    Culture   Final    NO GROWTH 4 DAYS Performed at [facility],  [address], [place], [place] [address]    Report Status PENDING  Incomplete      Radiology Studies: No results found.   Scheduled Meds: . vitamin C  500 mg Oral Daily  . atorvastatin  80 mg Oral Daily  . baricitinib  2 mg Oral Daily  . DULoxetine  60 mg Oral Daily  . enoxaparin (LOVENOX) injection  40 mg Subcutaneous Daily  . lactose free nutrition  237 mL Oral TID WC  . levothyroxine  75 mcg Oral Q0600  . methylPREDNISolone (SOLU-MEDROL) injection  1 mg/kg Intravenous Q12H  . mirabegron ER  50 mg Oral Daily  . sodium chloride flush  3 mL Intravenous Q12H  . zinc sulfate  220 mg Oral Daily   Continuous Infusions:   LOS: 5 days   Time spent: 25 minutes spent in the coordination of care today.    Teddy Spike, DO [facility]  If 7PM-7AM, please contact night-coverage [email or web address] 08/01/2020, 10:16 AM

## 2020-08-01 NOTE — Evaluation (Signed)
Clinical/Bedside Swallow Evaluation Patient Details  Name: Teresa Ward MRN: [id number] Date of Birth: April 02, 1931  Today's Date: 08/01/2020 Time: SLP Start Time (ACUTE ONLY): 1301 SLP Stop Time (ACUTE ONLY): 1328 SLP Time Calculation (min) (ACUTE ONLY): 27 min  Past Medical History:  Past Medical History:  Diagnosis Date  . Hypertension   . Stroke (HCC)   . Thyroid disease    Past Surgical History:  Past Surgical History:  Procedure Laterality Date  . JOINT REPLACEMENT     HPI:  Pt is an 87 y.o. female with medical history significant of hyperlipidemia, hypertension, hypothyroidism, CVA, anxiety, anemia of chronic disease, overactive bladder who presented with cough and fever and was found to be COVID-19 positive. CXR 1/17: Increasing interstitial and patchy opacities towards the lung bases with diffuse airways thickening, compatible with multifocal pneumonia in the setting of fever. Per referring MD, Dr. Vira Blanco note, " liquids appear to be majority of her diet". A dysphagia 3 diet with nectar thick liquids was initiated by Dr. Alinda Money on admission. Nadine, RN reported that the pt has been demonstrating coughing with nectar thick liquids and with solids.   Assessment / Plan / Recommendation Clinical Impression  Pt was seen for bedside swallow evaluation. She initially denied any symptoms of oropharyngeal swallowing. However, upon further inquiry at the end of the evaluation, she stated that she has been on thickened liquids in the past and inconsistently reported having had a swallow study. Pt's responses were intermittently unrelated to the questions and repetition was needed. Pt's reliability as a historian is questioned. Oral mechanism exam was limited due to pt's difficulty following commands; however, dentition was adequate for mastication. She exhibited symptoms of oropharyngeal dysphagia characterized by prolonged mastication of dysphagia 3 solids with moderate oral residue,  multiple swallows with solids, and signs of aspiration with all liquids. Throat clearing and coughing were eliminated with honey thick liquids via tsp. Pt independently used consecutive swallow with all liquids, but signs of aspiration persisted with thin and nectar thick liquids despite reduced bolus sizes via cup and tsp. No s/sx were noted with honey thick liquids via tsp. Moderate oral residue was reduced with multiple liquid washes. A dysphagia 2 diet with honey thick liquids via tsp is recommended at this time. A modified barium swallow study is recommended to further assess physiology. SLP Visit Diagnosis: Dysphagia, unspecified (R13.10)    Aspiration Risk  Moderate aspiration risk    Diet Recommendation Dysphagia 2 (Fine chop);Honey-thick liquid   Liquid Administration via: Spoon;No straw Medication Administration: Crushed with puree Supervision: Staff to assist with self feeding;Full supervision/cueing for compensatory strategies Compensations: Slow rate;Small sips/bites;Follow solids with liquid Postural Changes: Seated upright at 90 degrees    Other  Recommendations Oral Care Recommendations: Oral care BID Other Recommendations: Order thickener from pharmacy   Follow up Recommendations  (TBD)      Frequency and Duration min 2x/week  2 weeks       Prognosis Prognosis for Safe Diet Advancement: Fair Barriers to Reach Goals: Severity of deficits;Cognitive deficits      Swallow Study   General Date of Onset: 07/27/20 HPI: Pt is an 87 y.o. female with medical history significant of hyperlipidemia, hypertension, hypothyroidism, CVA, anxiety, anemia of chronic disease, overactive bladder who presented with cough and fever and was found to be COVID-19 positive. CXR 1/17: Increasing interstitial and patchy opacities towards the lung bases with diffuse airways thickening, compatible with multifocal pneumonia in the setting of fever. Per referring MD, Dr. Vira Blanco note, "  liquids  appear to be majority of her diet". A dysphagia 3 diet with nectar thick liquids was initiated by Dr. Alinda Money on admission. Nadine, RN reported that the pt has been demonstrating coughing with nectar thick liquids and with solids. Type of Study: Bedside Swallow Evaluation Previous Swallow Assessment: None Diet Prior to this Study: Dysphagia 3 (soft);Nectar-thick liquids Temperature Spikes Noted: No Respiratory Status: Nasal cannula History of Recent Intubation: No Behavior/Cognition: Alert;Cooperative;Pleasant mood Oral Cavity Assessment: Within Functional Limits Oral Care Completed by SLP: No Oral Cavity - Dentition: Adequate natural dentition;Missing dentition Vision: Functional for self-feeding Self-Feeding Abilities: Needs assist Patient Positioning: Upright in bed;Postural control adequate for testing Baseline Vocal Quality: Low vocal intensity Volitional Cough: Weak Volitional Swallow: Able to elicit    Oral/Motor/Sensory Function Overall Oral Motor/Sensory Function: Within functional limits   Ice Chips Ice chips: Impaired Pharyngeal Phase Impairments: Cough - Immediate   Thin Liquid Thin Liquid: Impaired Pharyngeal  Phase Impairments: Cough - Immediate;Cough - Delayed    Nectar Thick Nectar Thick Liquid: Impaired Presentation: Cup;Spoon;Straw Pharyngeal Phase Impairments: Throat Clearing - Immediate;Cough - Immediate;Cough - Delayed   Honey Thick Honey Thick Liquid: Impaired Presentation: Straw;Cup Pharyngeal Phase Impairments: Throat Clearing - Immediate;Cough - Delayed Other Comments:  (No s/sx of aspiration with honey thick liquids via tsp)   Puree Puree: Impaired Pharyngeal Phase Impairments: Multiple swallows   Solid     Solid: Impaired Presentation: Spoon Oral Phase Impairments: Impaired mastication Oral Phase Functional Implications: Impaired mastication;Oral residue     Teresa Frentz I. Vear Clock, MS, CCC-SLP Acute Rehabilitation Services Office number  [phone] Pager [phone]  Scheryl Marten 08/01/2020,2:09 PM

## 2020-08-02 ENCOUNTER — Inpatient Hospital Stay (HOSPITAL_COMMUNITY): Payer: Medicare HMO

## 2020-08-02 DIAGNOSIS — U071 COVID-19: Secondary | ICD-10-CM | POA: Diagnosis not present

## 2020-08-02 DIAGNOSIS — J1282 Pneumonia due to coronavirus disease 2019: Secondary | ICD-10-CM | POA: Diagnosis not present

## 2020-08-02 LAB — COMPREHENSIVE METABOLIC PANEL
ALT: 22 U/L (ref 0–44)
AST: 34 U/L (ref 15–41)
Albumin: 2.2 g/dL — ABNORMAL LOW (ref 3.5–5.0)
Alkaline Phosphatase: 69 U/L (ref 38–126)
Anion gap: 7 (ref 5–15)
BUN: 55 mg/dL — ABNORMAL HIGH (ref 8–23)
CO2: 27 mmol/L (ref 22–32)
Calcium: 8 mg/dL — ABNORMAL LOW (ref 8.9–10.3)
Chloride: 106 mmol/L (ref 98–111)
Creatinine, Ser: 0.97 mg/dL (ref 0.44–1.00)
GFR, Estimated: 57 mL/min — ABNORMAL LOW (ref >60)
Glucose, Bld: 166 mg/dL — ABNORMAL HIGH (ref 70–99)
Potassium: 5.1 mmol/L (ref 3.5–5.1)
Sodium: 140 mmol/L (ref 135–145)
Total Bilirubin: 0.8 mg/dL (ref 0.3–1.2)
Total Protein: 5.1 g/dL — ABNORMAL LOW (ref 6.5–8.1)

## 2020-08-02 LAB — CBC WITH DIFFERENTIAL/PLATELET
Abs Immature Granulocytes: 0.33 10*3/uL — ABNORMAL HIGH (ref 0.00–0.07)
Basophils Absolute: 0.1 10*3/uL (ref 0.0–0.1)
Basophils Relative: 0 %
Eosinophils Absolute: 0 10*3/uL (ref 0.0–0.5)
Eosinophils Relative: 0 %
HCT: 31.8 % — ABNORMAL LOW (ref 36.0–46.0)
Hemoglobin: 9.8 g/dL — ABNORMAL LOW (ref 12.0–15.0)
Immature Granulocytes: 2 %
Lymphocytes Relative: 3 %
Lymphs Abs: 0.4 10*3/uL — ABNORMAL LOW (ref 0.7–4.0)
MCH: 29.7 pg (ref 26.0–34.0)
MCHC: 30.8 g/dL (ref 30.0–36.0)
MCV: 96.4 fL (ref 80.0–100.0)
Monocytes Absolute: 0.7 10*3/uL (ref 0.1–1.0)
Monocytes Relative: 5 %
Neutro Abs: 12.1 10*3/uL — ABNORMAL HIGH (ref 1.7–7.7)
Neutrophils Relative %: 90 %
Platelets: 252 10*3/uL (ref 150–400)
RBC: 3.3 MIL/uL — ABNORMAL LOW (ref 3.87–5.11)
RDW: 14.6 % (ref 11.5–15.5)
WBC: 13.6 10*3/uL — ABNORMAL HIGH (ref 4.0–10.5)
nRBC: 0 % (ref 0.0–0.2)

## 2020-08-02 LAB — MAGNESIUM: Magnesium: 2.6 mg/dL — ABNORMAL HIGH (ref 1.7–2.4)

## 2020-08-02 LAB — C-REACTIVE PROTEIN: CRP: 2.2 mg/dL — ABNORMAL HIGH (ref <1.0)

## 2020-08-02 LAB — D-DIMER, QUANTITATIVE: D-Dimer, Quant: 2.89 ug/mL-FEU — ABNORMAL HIGH (ref 0.00–0.50)

## 2020-08-02 LAB — FERRITIN: Ferritin: 781 ng/mL — ABNORMAL HIGH (ref 11–307)

## 2020-08-02 NOTE — Progress Notes (Addendum)
Modified Barium Swallow Progress Note  Patient Details  Name: Teresa Ward MRN: [id number] Date of Birth: 1930/07/22  Today's Date: 08/02/2020  Modified Barium Swallow completed.  Full report located under Chart Review in the Imaging Section.  Brief recommendations include the following:  Clinical Impression  Pt demosntartes a moderate dysphagia with ill timed swallow with spillage of liquid boluses in tot he vestibule prior to swallow initiation. Though most of the bolus is ejected out during the swallow there is trace residue present that accumulates on the vocal folds and leads to throat clearing that does not eject penetrate. If cued to fully cough, or if deeper aspiration occurs, with larger sips, the pt achieve a hard cough and ejects most aspirate. She additionally demosntrates mild pharygneal weakness with vallecular residue due to supposed base of tongue weakness. Pt is recommended to consume dys 2/ honey thick liquids over nectar as bolus size can vary with honey and severity of penetration does not increase. Pt may tolerate upgrade if strength improves after acute illness.   Swallow Evaluation Recommendations       SLP Diet Recommendations: Dysphagia 2 (Fine chop) solids;Honey thick liquids   Liquid Administration via: Cup;Straw   Medication Administration: Whole meds with puree   Supervision: Full assist for feeding;Staff to assist with self feeding   Compensations: Slow rate;Small sips/bites;Hard cough after swallow   Postural Changes: Seated upright at 90 degrees   Oral Care Recommendations: Oral care BID   Other Recommendations: Order thickener from pharmacy   Teresa Ditty, MA CCC-SLP  Acute Rehabilitation Services Pager [phone] Office [phone]  Claudine Mouton 08/02/2020,3:19 PM

## 2020-08-02 NOTE — Progress Notes (Signed)
PROGRESS NOTE    Danicka Hourihan  ZDG:[id number] DOB: 07/22/1930 DOA: 07/26/2020 PCP: Gillian Scarce, MD   Brief Narrative:   Filippa Yarbough a 87 y.o.femalewith medical history significant ofhyperlipidemia, hypertension, hypothyroidism, CVA, anxiety, anemia of chronic disease, overactive bladder who presents with cough and fever. Patient lives at home but has family around 24/7. She was evaluated at [facility] - reported 4 days of cough/fever as high as 104 the day that she presented. Patient is unvaccinated denies recent travel or sick contacts. In ED: Inflammatory markers; D-dimer 3.54, procalcitonin 0.3, LDH 170, TG 64, fibrinogen 744, CRP 13.8, ferritin 653. Started on remdesivir and steroids and accepted for admission initially to [facility] but due to lack of beds this was switched to [facility]  1/24: Needs to be more active. Still on O2 support, but minimal. Let's wean that off. Likely d/c to home with [facility] tomorrow. Dtr updated.    Assessment & Plan: Pneumonia due to COVID-19 Acute hypoxic respiratory failure, POA     - remdes completed; continue steroids, baricitinib     - ambulation, proning, IS, FV     - wean O2 as able; down to 2L , not a baseline, let's get her more active and opening her lungs  Hypertension, essential     - BP remains borderline low, continue holding home meds  AKI without history of CKD     - resolved, at baseline  Hypokalemia     - resolved  Leukemoid reaction     - secondary to steroids; follow  Chronic overactive bladder     -continue Myrbetriq  History of CVA     - Baseline = moderately somnolent per family; moderate(?) Max assist to get out of chair/bed but able to ambulate with assistance     - Sleeps majority of the day, skips meals on occasion, liquids appear to be majority of her diet.She has to be awoken to eat. She feeds her self mostly.     - Needs ambulation help at all times due to balance issues  (chronic)     - Very limited vision (chronic diplopia with CVA)  Hypothyroidism     - continue home synthroid  Anemia of chronic disease     - Hgb 9.8 this AM, no signs of bleed, monitor  Dysphagia     - on fine-chop, honey thick diet     - to go for MBS today  DVT prophylaxis: lovenox Code Status: DNR Family Communication: Spoke w/ dtr by phone   Status is: Inpatient  Remains inpatient appropriate because:Inpatient level of care appropriate due to severity of illness   Dispo: The patient is from: Home              Anticipated d/c is to: Home              Anticipated d/c date is: 1 day              Patient currently is not medically stable to d/c.   Difficult to place patient No  Antimicrobials:  . Remdesivir  Subjective: No acute events ON.   Objective: Vitals:   08/01/20 0437 08/01/20 1712 08/01/20 2044 08/02/20 0445  BP: (!) 91/57 117/78 130/63 124/66  Pulse: 62 75 69 70  Resp: 20 20 18 18   Temp: 98.2 F (36.8 C) 98 F (36.7 C) 98 F (36.7 C) 97.7 F (36.5 C)  TempSrc: Oral     SpO2: 94%  97% 100%  Weight:        Intake/Output Summary (Last 24 hours) at 08/02/2020 1425 Last data filed at 08/01/2020 2041 Gross per 24 hour  Intake --  Output 700 ml  Net -700 ml   Filed Weights   07/26/20 2200 07/27/20 1931  Weight: 62.2 kg 59.9 kg    Examination:  General: 87 y.o. female resting in bed in NAD ENMT: Nares patent w/o discharge, orophaynx clear, dentition normal, ears w/o discharge/lesions/ulcers Neck: Supple, trachea midline Cardiovascular: RRR, +S1, S2, no m/g/r, equal pulses throughout Respiratory: CTABL, no w/r/r, normal WOB GI: BS+, NDNT, no masses noted, no organomegaly noted MSK: No e/c/c Skin: No rashes, bruises, ulcerations noted Neuro: alert, following some commands   Data Reviewed: I have personally reviewed following labs and imaging studies.  CBC: Recent Labs  Lab 07/29/20 0705 07/30/20 0433 07/31/20 0456 08/01/20 0336  08/02/20 0422  WBC 15.9* 21.4* 16.4* 15.9* 13.6*  NEUTROABS 13.7* 20.4* 14.8* 14.3* 12.1*  HGB 11.0* 11.7* 10.3* 10.2* 9.8*  HCT 32.0* 35.3* 31.0* 31.1* 31.8*  MCV 89.4 91.5 91.4 91.7 96.4  PLT 247 255 231 221 252   Basic Metabolic Panel: Recent Labs  Lab 07/28/20 0616 07/29/20 0705 07/30/20 0433 07/31/20 0456 08/01/20 0336 08/02/20 0422  NA 138 134* 137 139 139 140  K 3.8 3.9 4.2 4.2 4.6 5.1  CL 102 100 100 103 104 106  CO2 23 23 26 26 28 27   GLUCOSE 146* 180* 155* 200* 175* 166*  BUN 32* 56* 54* 64* 65* 55*  CREATININE 1.10* 1.60* 1.34* 1.28* 1.23* 0.97  CALCIUM 8.2* 7.8* 7.8* 7.8* 8.1* 8.0*  MG 2.1 2.3 2.5* 2.7*  --  2.6*  PHOS 3.7 3.8 4.3 2.9  --   --    GFR: CrCl cannot be calculated (Unknown ideal weight.). Liver Function Tests: Recent Labs  Lab 07/29/20 0705 07/30/20 0433 07/31/20 0456 08/01/20 0336 08/02/20 0422  AST 40 44* 38 33 34  ALT 27 28 25 25 22   ALKPHOS 76 86 77 77 69  BILITOT 0.8 0.9 0.6 0.9 0.8  PROT 4.9* 5.1* 4.8* 5.0* 5.1*  ALBUMIN 2.1* 2.3* 2.2* 2.2* 2.2*   No results for input(s): LIPASE, AMYLASE in the last 168 hours. No results for input(s): AMMONIA in the last 168 hours. Coagulation Profile: No results for input(s): INR, PROTIME in the last 168 hours. Cardiac Enzymes: No results for input(s): CKTOTAL, CKMB, CKMBINDEX, TROPONINI in the last 168 hours. BNP (last 3 results) No results for input(s): PROBNP in the last 8760 hours. HbA1C: No results for input(s): HGBA1C in the last 72 hours. CBG: Recent Labs  Lab 07/27/20 1835  GLUCAP 139*   Lipid Profile: No results for input(s): CHOL, HDL, LDLCALC, TRIG, CHOLHDL, LDLDIRECT in the last 72 hours. Thyroid Function Tests: No results for input(s): TSH, T4TOTAL, FREET4, T3FREE, THYROIDAB in the last 72 hours. Anemia Panel: Recent Labs    07/31/20 0456 08/02/20 0422  FERRITIN 1,215* 781*   Sepsis Labs: Recent Labs  Lab 07/26/20 2035 07/26/20 2100 07/27/20 0025  PROCALCITON   --  0.30  --   LATICACIDVEN 1.6  --  1.4    Recent Results (from the past 240 hour(s))  Resp Panel by RT-PCR (Flu A&B, Covid) Nasopharyngeal Swab     Status: Abnormal   Collection Time: 07/26/20  8:32 PM   Specimen: Nasopharyngeal Swab; Nasopharyngeal(NP) swabs in vial transport medium  Result Value Ref Range Status   SARS Coronavirus 2 by RT PCR POSITIVE (A) NEGATIVE Final  Comment: RESULT CALLED TO, READ BACK BY AND VERIFIED WITH: LISA ADKINS RN AT 2149 ON 07/26/20 BY I.SUGUT (NOTE) SARS-CoV-2 target nucleic acids are DETECTED.  The SARS-CoV-2 RNA is generally detectable in upper respiratory specimens during the acute phase of infection. Positive results are indicative of the presence of the identified virus, but do not rule out bacterial infection or co-infection with other pathogens not detected by the test. Clinical correlation with patient history and other diagnostic information is necessary to determine patient infection status. The expected result is Negative.  Fact Sheet for Patients: BloggerCourse.com  Fact Sheet for Healthcare Providers: SeriousBroker.it  This test is not yet approved or cleared by the [place] FDA and  has been authorized for detection and/or diagnosis of SARS-CoV-2 by FDA under an Emergency Use Authorization (EUA).  This EUA will remain in effect (meaning this  test can be used) for the duration of  the COVID-19 declaration under Section 564(b)(1) of the Act, 21 U.S.C. section 360bbb-3(b)(1), unless the authorization is terminated or revoked sooner.     Influenza A by PCR NEGATIVE NEGATIVE Final   Influenza B by PCR NEGATIVE NEGATIVE Final    Comment: (NOTE) The Xpert Xpress SARS-CoV-2/FLU/RSV plus assay is intended as an aid in the diagnosis of influenza from Nasopharyngeal swab specimens and should not be used as a sole basis for treatment. Nasal washings and aspirates are unacceptable  for Xpert Xpress SARS-CoV-2/FLU/RSV testing.  Fact Sheet for Patients: BloggerCourse.com  Fact Sheet for Healthcare Providers: SeriousBroker.it  This test is not yet approved or cleared by the [place] FDA and has been authorized for detection and/or diagnosis of SARS-CoV-2 by FDA under an Emergency Use Authorization (EUA). This EUA will remain in effect (meaning this test can be used) for the duration of the COVID-19 declaration under Section 564(b)(1) of the Act, 21 U.S.C. section 360bbb-3(b)(1), unless the authorization is terminated or revoked.  Performed at [facility], [address] [place], [place] [address]   Blood Culture (routine x 2)     Status: None   Collection Time: 07/26/20  8:59 PM   Specimen: Left Antecubital; Blood  Result Value Ref Range Status   Specimen Description   Final    LEFT ANTECUBITAL Performed at [facility], [address] [place], [place] [address]    Special Requests   Final    BOTTLES DRAWN AEROBIC AND ANAEROBIC Blood Culture adequate volume Performed at [facility], [address] [place], [place] [address]    Culture   Final    NO GROWTH 5 DAYS Performed at [facility], [address], [place], [place] [address]    Report Status 08/01/2020 FINAL  Final  Blood Culture (routine x 2)     Status: None   Collection Time: 07/26/20  9:10 PM   Specimen: Right Antecubital; Blood  Result Value Ref Range Status   Specimen Description   Final    RIGHT ANTECUBITAL Performed at [facility], [address] [place], [place] [address]    Special Requests   Final    BOTTLES DRAWN AEROBIC AND ANAEROBIC Blood Culture results may not be optimal due to an inadequate volume of blood received in culture bottles Performed at [facility], [address] [place], [place] [address]    Culture   Final    NO GROWTH 5 DAYS Performed at  [facility], [address], [place], [place] [address]    Report Status 08/01/2020 FINAL  Final      Radiology Studies: No results found.   Scheduled Meds: . vitamin C  500 mg Oral Daily  . atorvastatin  80 mg Oral Daily  . baricitinib  2 mg Oral Daily  . DULoxetine  60 mg Oral Daily  . enoxaparin (LOVENOX) injection  40 mg Subcutaneous Daily  . lactose free nutrition  237 mL Oral TID WC  . levothyroxine  75 mcg Oral Q0600  . methylPREDNISolone (SOLU-MEDROL) injection  1 mg/kg Intravenous Q12H  . mirabegron ER  50 mg Oral Daily  . sodium chloride flush  3 mL Intravenous Q12H  . zinc sulfate  220 mg Oral Daily   Continuous Infusions:   LOS: 6 days    Time spent: 25 minutes spent in the coordination of care today.    Teddy Spike, DO [facility]  If 7PM-7AM, please contact night-coverage [email or web address] 08/02/2020, 2:25 PM

## 2020-08-03 DIAGNOSIS — J1282 Pneumonia due to coronavirus disease 2019: Secondary | ICD-10-CM | POA: Diagnosis not present

## 2020-08-03 DIAGNOSIS — U071 COVID-19: Secondary | ICD-10-CM | POA: Diagnosis not present

## 2020-08-03 LAB — CREATININE, SERUM
Creatinine, Ser: 0.93 mg/dL (ref 0.44–1.00)
GFR, Estimated: 59 mL/min — ABNORMAL LOW (ref >60)

## 2020-08-03 MED ORDER — PREDNISONE 10 MG PO TABS: ORAL_TABLET | ORAL | 0 refills | Status: AC

## 2020-08-03 MED ORDER — ALBUTEROL SULFATE HFA 108 (90 BASE) MCG/ACT IN AERS: 2.0000 | INHALATION_SPRAY | Freq: Four times a day (QID) | RESPIRATORY_TRACT | 2 refills | Status: AC | PRN

## 2020-08-03 NOTE — Progress Notes (Signed)
Occupational Therapy Treatment Patient Details Name: Teresa Ward MRN: [id number] DOB: 14-Sep-1930 Today's Date: 08/03/2020    History of present illness Teresa Ward is a 87 y.o. female with medical history significant of hyperlipidemia, hypertension, hypothyroidism, CVA, anxiety, anemia of chronic disease, overactive bladder who presents with cough and fever. Pt found to be COVID+.   OT comments  Pt making steady progress towards OT goals this session. Pt received seated in recliner upon arrival on RA with sats 92% at rest HR 81 bpm.  Pt continues to present wtih cognition deficits, decreased activity tolerance, generalized deconditioning and impaired balance. Pt required MOD A to sit<>stand from recliner with RW needing assist to  power into standing from recliner needing cues for hand placment and physical assist at hips to bring hips into full extension in standing.  O2 briefly drop to 86% with mobility tasks but quickly increases with seated rest break. Pt very soft spoken with a fixed gaze forward towards TV needing max multimodal cues to redirect pt and follow commands. Pt currently requires supervision for UB grooming tasks and total A for LB ADLs. Pt would continue to benefit from skilled occupational therapy while admitted and after d/c to address the below listed limitations in order to improve overall functional mobility and facilitate independence with BADL participation. DC plan remains appropriate, will follow acutely per POC.     Follow Up Recommendations  SNF;Supervision/Assistance - 24 hour;Other (comment) (will need 24/7 physical assistance if plan is to discharge home)    Equipment Recommendations  3 in 1 bedside commode;Wheelchair (measurements OT);Wheelchair cushion (measurements OT)    Recommendations for Other Services      Precautions / Restrictions Precautions Precautions: Fall;Other (comment) Precaution Comments: monitor O2, soft spoken, ? vision,  incontinent Restrictions Weight Bearing Restrictions: No       Mobility Bed Mobility Overal bed mobility: Needs Assistance Bed Mobility: Supine to Sit     Supine to sit: Min assist;HOB elevated     General bed mobility comments: pt recieved and returned to recliner  Transfers Overall transfer level: Needs assistance Equipment used: Rolling walker (2 wheeled) Transfers: Sit to/from Stand Sit to Stand: Mod assist         General transfer comment: MOD A to power into standing from recliner needing cues for hand placment and physical assist at hips to bring hips into full extension    Balance Overall balance assessment: Needs assistance Sitting-balance support: No upper extremity supported;Feet supported Sitting balance-Leahy Scale: Poor Sitting balance - Comments: EOB with min-mod assist with posterior right bias   Standing balance support: Bilateral upper extremity supported Standing balance-Leahy Scale: Poor Standing balance comment: bil UE support in standing with physical assist                           ADL either performed or assessed with clinical judgement   ADL Overall ADL's : Needs assistance/impaired     Grooming: Wash/dry face;Sitting;Supervision/safety;Set up Grooming Details (indicate cue type and reason): sitting in recliner             Lower Body Dressing: Total assistance;Sitting/lateral leans Lower Body Dressing Details (indicate cue type and reason): pt unable to locate BLES from sitting in recliner, total A to adjust socks from recliner Toilet Transfer: Moderate assistance;RW Toilet Transfer Details (indicate cue type and reason): sit<>stand only from recliner         Functional mobility during ADLs: Moderate assistance;Rolling walker;Cueing for sequencing;Cueing for  safety (sit<>stand only) General ADL Comments: pt continues to present wtih cognition deficits, decreased activity tolerance, and impaired balance     Vision    Additional Comments: Pt with fixed gaze during session,however difficult to assess d/t cog   Perception     Praxis      Cognition Arousal/Alertness: Awake/alert Behavior During Therapy: Flat affect Overall Cognitive Status: No family/caregiver present to determine baseline cognitive functioning Area of Impairment: Orientation;Following commands;Safety/judgement;Attention                 Orientation Level: Disoriented to;Situation (pt reports she lives with her dad?) Current Attention Level: Sustained   Following Commands: Follows one step commands with increased time;Follows one step commands inconsistently Safety/Judgement: Decreased awareness of safety;Decreased awareness of deficits   Problem Solving: Slow processing General Comments: pt very soft spoken, presents with sustained attention as pt noted to be distracted by TV with a fixed gaze on it        Exercises General Exercises - Lower Extremity Long Arc Quad: AROM;Both;Seated;10 reps Hip Flexion/Marching: AROM;Both;Seated;10 reps (tactile cues)   Shoulder Instructions       General Comments pt on RA during session sats 92% at start of session HR 81 bpm, brief desat to 86% while standing but rebounds quickly with seated rest break. HR max 100 bpm with mobility. Issued pt IS with pt able to pull 550 mL consistently    Pertinent Vitals/ Pain       Pain Assessment: Faces Faces Pain Scale: Hurts a little bit Pain Location: groin area from purewick Pain Descriptors / Indicators: Discomfort Pain Intervention(s): Repositioned;Monitored during session;Limited activity within patient's tolerance;Other (comment) (repositioned purewick and turned down suction)  Home Living                                          Prior Functioning/Environment              Frequency  Min 2X/week        Progress Toward Goals  OT Goals(current goals can now be found in the care plan section)  Progress  towards OT goals: Progressing toward goals  Acute Rehab OT Goals Patient Stated Goal: to improve mobility quality OT Goal Formulation: Patient unable to participate in goal setting Time For Goal Achievement: 08/11/20 Potential to Achieve Goals: Fair  Plan Discharge plan remains appropriate;Frequency remains appropriate    Co-evaluation                 AM-PAC OT "6 Clicks" Daily Activity     Outcome Measure   Help from another person eating meals?: A Lot Help from another person taking care of personal grooming?: A Little Help from another person toileting, which includes using toliet, bedpan, or urinal?: A Lot Help from another person bathing (including washing, rinsing, drying)?: A Lot Help from another person to put on and taking off regular upper body clothing?: A Lot Help from another person to put on and taking off regular lower body clothing?: Total 6 Click Score: 12    End of Session Equipment Utilized During Treatment: Gait belt;Rolling walker  OT Visit Diagnosis: Unsteadiness on feet (R26.81);Other abnormalities of gait and mobility (R26.89);Muscle weakness (generalized) (M62.81);Other symptoms and signs involving cognitive function   Activity Tolerance Patient tolerated treatment well   Patient Left in chair;with call bell/phone within reach;with chair alarm set   Nurse Communication Mobility  status        Time: 1035-1055 OT Time Calculation (min): 20 min  Charges: OT General Charges $OT Visit: 1 Visit OT Treatments $Self Care/Home Management : 8-22 mins  Lenor Derrick., COTA/L Acute Rehabilitation Services [phone]    Barron Schmid 08/03/2020, 2:14 PM

## 2020-08-03 NOTE — Progress Notes (Signed)
Physical Therapy Treatment Patient Details Name: Teresa Ward MRN: [id number] DOB: 1931/06/06 Today's Date: 08/03/2020    History of Present Illness Teresa Ward is a 87 y.o. female with medical history significant of hyperlipidemia, hypertension, hypothyroidism, CVA, anxiety, anemia of chronic disease, overactive bladder who presents with cough and fever. Pt found to be COVID+.    PT Comments    Pt supine on arrival with improved vocal volume with maintained lack of visual focus with pt unable to state baseline vision but stating she cannot see items on tray. Pt with increased attention today and able to progress to standing, limited gait and transfer to chair. Pt able to perform seated HEp and assisted with eating some of her breakfast via spoon as pt unable to see tray. Will continue to follow with SNF remaining appropriate.    Follow Up Recommendations  SNF;Supervision/Assistance - 24 hour     Equipment Recommendations  Wheelchair (measurements PT);Wheelchair cushion (measurements PT)    Recommendations for Other Services       Precautions / Restrictions Precautions Precautions: Fall;Other (comment) Precaution Comments: monitor O2, soft spoken, ? vision, incontinent    Mobility  Bed Mobility Overal bed mobility: Needs Assistance Bed Mobility: Supine to Sit     Supine to sit: Min assist;HOB elevated     General bed mobility comments: HOB 30 degrees with min assist to fully pivot toward left side of bed. Mod assist for sitting balance EOB due to posterior right lean  Transfers Overall transfer level: Needs assistance   Transfers: Sit to/from Stand Sit to Stand: Mod assist         General transfer comment: physical assist for initiation, anterior translation and rise with RW present. In standing reliant on bil UE support and physical assist for balance. Assist to control descent to chair  Ambulation/Gait Ambulation/Gait assistance: Mod assist Gait Distance  (Feet): 8 Feet Assistive device: Rolling walker (2 wheeled) Gait Pattern/deviations: Shuffle;Trunk flexed   Gait velocity interpretation: <1.31 ft/sec, indicative of household ambulator General Gait Details: pt with posterior right lean with RW and assist to direct and guide RW. Pt able to walk limited distance to sink then chair with assist.   Stairs             Wheelchair Mobility    Modified Rankin (Stroke Patients Only)       Balance Overall balance assessment: Needs assistance Sitting-balance support: No upper extremity supported;Feet supported Sitting balance-Leahy Scale: Poor Sitting balance - Comments: EOB with min-mod assist with posterior right bias     Standing balance-Leahy Scale: Poor Standing balance comment: bil UE support in standing with physical assist                            Cognition Arousal/Alertness: Awake/alert Behavior During Therapy: Flat affect Overall Cognitive Status: No family/caregiver present to determine baseline cognitive functioning Area of Impairment: Orientation;Following commands;Safety/judgement                 Orientation Level: Disoriented to;Time Current Attention Level: Sustained   Following Commands: Follows one step commands with increased time;Follows one step commands inconsistently Safety/Judgement: Decreased awareness of safety;Decreased awareness of deficits   Problem Solving: Slow processing General Comments: pt soft spoken but significantly improved from last session. Pt with limited vision with inability to clearly see meal tray or items in room      Exercises General Exercises - Lower Extremity Long Arc Quad: AROM;Both;Seated;10 reps Hip Flexion/Marching:  AROM;Both;Seated;10 reps (tactile cues)    General Comments        Pertinent Vitals/Pain Pain Assessment: No/denies pain    Home Living                      Prior Function            PT Goals (current goals can now  be found in the care plan section) Progress towards PT goals: Progressing toward goals    Frequency    Min 2X/week      PT Plan Current plan remains appropriate    Co-evaluation              AM-PAC PT "6 Clicks" Mobility   Outcome Measure  Help needed turning from your back to your side while in a flat bed without using bedrails?: A Little Help needed moving from lying on your back to sitting on the side of a flat bed without using bedrails?: A Lot Help needed moving to and from a bed to a chair (including a wheelchair)?: A Lot Help needed standing up from a chair using your arms (e.g., wheelchair or bedside chair)?: A Lot Help needed to walk in hospital room?: A Lot Help needed climbing 3-5 steps with a railing? : Total 6 Click Score: 12    End of Session Equipment Utilized During Treatment: Gait belt Activity Tolerance: Patient limited by fatigue Patient left: in chair;with call bell/phone within reach;with chair alarm set Nurse Communication: Mobility status PT Visit Diagnosis: Other abnormalities of gait and mobility (R26.89);Muscle weakness (generalized) (M62.81)     Time: 2035-5974 PT Time Calculation (min) (ACUTE ONLY): 32 min  Charges:  $Gait Training: 8-22 mins $Therapeutic Activity: 8-22 mins                     Teresa Ward P, PT Acute Rehabilitation Services Pager: [phone] Office: [phone]    Teresa Ward Teresa Ward 08/03/2020, 1:40 PM

## 2020-08-03 NOTE — Discharge Summary (Signed)
Physician Discharge Summary  [name] ZOX:[id number] DOB: 1931-04-27 DOA: 07/26/2020  PCP: Gillian Scarce, MD  Admit date: 07/26/2020 Discharge date: 08/03/2020  Admitted From: Home Disposition:  Home with [facility]  Recommendations for Outpatient Follow-up:  1. Follow up with PCP in 1 weeks (tele visit is fine) 2. Please obtain BMP/CBC in one week 3. Isolation through 08/16/20  Home Health: yes  Equipment/Devices: WC, WCC   Discharge Condition: Stable  CODE STATUS: DNR  Diet recommendation: SLP Diet Recommendations: Dysphagia 2 (Fine chop) solids;Honey thick liquids Liquid Administration via: Cup;Straw Medication Administration: Whole meds with puree Supervision: Full assist for feeding;Staff to assist with self feeding Compensations: Slow rate;Small sips/bites;Hard cough after swallow Postural Changes: Seated upright at 90 degrees Oral Care Recommendations: Oral care BID  Brief/Interim Summary: Teresa Ward a 87 y.o.femalewith medical history significant ofhyperlipidemia, hypertension, hypothyroidism, CVA, anxiety, anemia of chronic disease, overactive bladder who presents with cough and fever. Patient lives at home but has family around 24/7. She was evaluated at [facility] - reported 4 days of cough/fever as high as 104 the day that she presented. Patient is unvaccinated denies recent travel or sick contacts. In ED: Inflammatory markers; D-dimer 3.54, procalcitonin 0.3, LDH 170, TG 64, fibrinogen 744, CRP 13.8, ferritin 653. Started on remdesivir and steroids and accepted for admission initially to [facility] but due to lack of beds this was switched to [facility]  Discharge Diagnoses: Pneumonia due to COVID-19 Acute hypoxic respiratory failure, POA - remdes completed; continue steroids, baricitinib - ambulation, proning, IS, FV - 1/25: No on RA and stable, transition to prednisone quick taper and rescue inhaler, good for d/c to home w/  HH  Hypertension, essential - 1/25: BP looks better today. Ok to resume BP med. Needs follow up with PCP for lab work within a week d/t potassium levels  AKI without history of CKD - resolved, at baseline  Hypokalemia - resolved  Leukemoid reaction - secondary to steroids; follow  Chronic overactive bladder -continue Myrbetriq  History of CVA - Baseline = moderately somnolent per family; moderate(?) Max assist to get out of chair/bed but able to ambulate with assistance - Sleeps majority of the day, skips meals on occasion, liquids appear to be majority of her diet.She has to be awoken to eat. She feeds her self mostly. 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)  Hypothyroidism - continue home synthroid  Anemia of chronic disease - no signs of bleed, monitor  Dysphagia     - on fine-chop, honey thick diet     - See diet recommendation above.   Discharge Instructions   Allergies as of 08/03/2020   No Known Allergies     Medication List    TAKE these medications   aspirin 325 MG tablet Take 325 mg by mouth daily.   atorvastatin 80 MG tablet Commonly known as: LIPITOR Take 80 mg by mouth daily.   DULoxetine 60 MG capsule Commonly known as: CYMBALTA Take 60 mg by mouth daily.   guaifenesin 100 MG/5ML syrup Commonly known as: ROBITUSSIN Take 200 mg by mouth 3 (three) times daily as needed for cough.   levothyroxine 75 MCG tablet Commonly known as: SYNTHROID Take 75 mcg by mouth daily.   loratadine 10 MG tablet Commonly known as: CLARITIN Take 10 mg by mouth daily as needed for allergies.   Myrbetriq 50 MG Tb24 tablet Generic drug: mirabegron ER Take  50 mg by mouth daily.   predniSONE 10 MG tablet Commonly known as: DELTASONE Take 4 tablets (40 mg total) by mouth daily for 1 day, THEN 2 tablets (20 mg total) daily for 1  day, THEN 1 tablet (10 mg total) daily for 1 day. Start taking on: August 03, 2020   valsartan 160 MG tablet Commonly known as: DIOVAN Take 160 mg by mouth daily.       Follow-up Information    [facility]   Specialty: Home Health Services Contact information: [address] [place] [address] [phone]              No Known Allergies  Consultations:  None  Procedures/Studies: DG Chest Portable 1 View  Result Date: 07/26/2020 CLINICAL DATA:  Cough, fever for 2 days EXAM: PORTABLE CHEST 1 VIEW COMPARISON:  Radiograph 04/02/2020 FINDINGS: Some increasing interstitial and patchy opacities towards the lung bases with diffuse airways thickening. No pneumothorax. Trace right and small left pleural effusion lateral pleural thickening. The aorta is calcified. The remaining cardiomediastinal contours are unremarkable. No acute osseous or soft tissue abnormality. Degenerative changes are present in the imaged spine and shoulders. IMPRESSION: 1. Increasing interstitial and patchy opacities towards the lung bases with diffuse airways thickening, compatible with multifocal pneumonia in the setting of fever. 2. Trace right and small left pleural effusions. Electronically Signed   By: Kreg ShropshirePrice  DeHay M.D.   On: 07/26/2020 19:03   DG Swallowing Func-Speech Pathology  Result Date: 08/02/2020 Objective Swallowing Evaluation: Type of Study: MBS-Modified Barium Swallow Study  Patient Details Name: Teresa Ward MRN: [id number] Date of Birth: 03-04-1931 Today's Date: 08/02/2020 Time: SLP Start Time (ACUTE ONLY): 1400 -SLP Stop Time (ACUTE ONLY): 1423 SLP Time Calculation (min) (ACUTE ONLY): 23 min Past Medical History: Past Medical History: Diagnosis Date . Hypertension  . Stroke (HCC)  . Thyroid disease  Past Surgical History: Past Surgical History: Procedure Laterality Date . JOINT REPLACEMENT   HPI: Pt is an 87 y.o. female with medical history significant of  hyperlipidemia, hypertension, hypothyroidism, CVA, anxiety, anemia of chronic disease, overactive bladder who presented with cough and fever and was found to be COVID-19 positive. CXR 1/17: Increasing interstitial and patchy opacities towards the lung bases with diffuse airways thickening, compatible with multifocal pneumonia in the setting of fever. Per referring MD, Dr. Vira BlancoLancaster's note, " liquids appear to be majority of her diet". A dysphagia 3 diet with nectar thick liquids was initiated by Dr. Alinda MoneyMelvin on admission. Nadine, RN reported that the pt has been demonstrating coughing with nectar thick liquids and with solids.  No data recorded Assessment / Plan / Recommendation CHL IP CLINICAL IMPRESSIONS 08/02/2020 Clinical Impression            Pt demonstartes a moderate dysphagia with ill timed swallow with spillage of liquid boluses in tot he vestibule prior to swallow initiation. Though most of the bolus is ejected out during the swallow there is trace residue present that accumulates on the vocal folds and leads to throat clearing that does not eject penetrate. If cued to fully cough, or if deeper aspiration occurs, with larger sips, the pt achieve a hard cough and ejects most aspirate. She additionally demosntrates mild pharygneal weakness with vallecular residue due to supposed base of tongue weakness. Pt is recommended to consume dys 2/ honey thick liquids over nectar as bolus size can vary with honey and severity of penetration does not increase. Pt may tolerate upgrade if  strength improves after acute illness. SLP Visit Diagnosis Dysphagia, unspecified (R13.10);Dysphagia, oropharyngeal phase (R13.12) Attention and concentration deficit following -- Frontal lobe and executive function deficit following -- Impact on safety and function Moderate aspiration risk   CHL IP TREATMENT RECOMMENDATION 08/02/2020 Treatment Recommendations Therapy as outlined in treatment plan below;F/U MBS in --- days (Comment)    Prognosis 08/02/2020 Prognosis for Safe Diet Advancement Fair Barriers to Reach Goals -- Barriers/Prognosis Comment -- CHL IP DIET RECOMMENDATION 08/02/2020 SLP Diet Recommendations Dysphagia 2 (Fine chop) solids;Honey thick liquids Liquid Administration via Cup;Straw Medication Administration Whole meds with puree Compensations Slow rate;Small sips/bites;Hard cough after swallow Postural Changes Seated upright at 90 degrees   CHL IP OTHER RECOMMENDATIONS 08/02/2020 Recommended Consults -- Oral Care Recommendations Oral care BID Other Recommendations Order thickener from pharmacy   CHL IP FOLLOW UP RECOMMENDATIONS 08/02/2020 Follow up Recommendations Skilled Nursing facility   [facility] IP FREQUENCY AND DURATION 08/01/2020 Speech Therapy Frequency (ACUTE ONLY) min 2x/week Treatment Duration 2 weeks      CHL IP ORAL PHASE 08/02/2020 Oral Phase Impaired Oral - Pudding Teaspoon -- Oral - Pudding Cup -- Oral - Honey Teaspoon -- Oral - Honey Cup -- Oral - Nectar Teaspoon -- Oral - Nectar Cup -- Oral - Nectar Straw -- Oral - Thin Teaspoon -- Oral - Thin Cup -- Oral - Thin Straw -- Oral - Puree -- Oral - Mech Soft -- Oral - Regular -- Oral - Multi-Consistency -- Oral - Pill -- Oral Phase - Comment --  CHL IP PHARYNGEAL PHASE 08/02/2020 Pharyngeal Phase Impaired Pharyngeal- Pudding Teaspoon -- Pharyngeal -- Pharyngeal- Pudding Cup -- Pharyngeal -- Pharyngeal- Honey Teaspoon Delayed swallow initiation-vallecula;Delayed swallow initiation-pyriform sinuses;Penetration/Aspiration before swallow Pharyngeal Material enters airway, remains ABOVE vocal cords then ejected out;Material enters airway, remains ABOVE vocal cords and not ejected out;Material does not enter airway Pharyngeal- Honey Cup Delayed swallow initiation-pyriform sinuses;Penetration/Aspiration before swallow;Pharyngeal residue - valleculae;Reduced tongue base retraction Pharyngeal Material enters airway, remains ABOVE vocal cords then ejected out;Material enters airway,  remains ABOVE vocal cords and not ejected out;Material does not enter airway Pharyngeal- Nectar Teaspoon Delayed swallow initiation-pyriform sinuses;Penetration/Aspiration before swallow;Trace aspiration;Pharyngeal residue - valleculae;Reduced tongue base retraction Pharyngeal Material enters airway, passes BELOW cords then ejected out;Material enters airway, CONTACTS cords and not ejected out;Material enters airway, CONTACTS cords and then ejected out Pharyngeal- Nectar Cup Penetration/Aspiration before swallow;Pharyngeal residue - valleculae;Reduced tongue base retraction;Moderate aspiration Pharyngeal Material enters airway, passes BELOW cords and not ejected out despite cough attempt by patient;Material enters airway, passes BELOW cords then ejected out Pharyngeal- Nectar Straw NT Pharyngeal -- Pharyngeal- Thin Teaspoon -- Pharyngeal -- Pharyngeal- Thin Cup -- Pharyngeal -- Pharyngeal- Thin Straw -- Pharyngeal -- Pharyngeal- Puree Delayed swallow initiation-vallecula;Reduced tongue base retraction;Pharyngeal residue - valleculae Pharyngeal Material does not enter airway Pharyngeal- Mechanical Soft Delayed swallow initiation-vallecula;Pharyngeal residue - valleculae;Reduced tongue base retraction Pharyngeal -- Pharyngeal- Regular Delayed swallow initiation-vallecula;Pharyngeal residue - valleculae;Reduced tongue base retraction Pharyngeal -- Pharyngeal- Multi-consistency -- Pharyngeal -- Pharyngeal- Pill -- Pharyngeal -- Pharyngeal Comment --  No flowsheet data found. DeBlois, Riley Nearing 08/02/2020, 3:20 PM                 Subjective: "I'm ok"  Discharge Exam: Vitals:   08/03/20 0434 08/03/20 0833  BP: 110/60 (!) 132/56  Pulse: 82 (!) 57  Resp: 16   Temp: 98 F (36.7 C) 98.4 F (36.9 C)  SpO2: 98% 94%   Vitals:   08/02/20 2102 08/02/20 2130 08/03/20 0434 08/03/20 0833  BP:  108/62  110/60 (!) 132/56  Pulse: 80  82 (!) 57  Resp: 16  16   Temp: 98.2 F (36.8 C)  98 F (36.7 C) 98.4 F  (36.9 C)  TempSrc: Oral  Oral Oral  SpO2: 96% 96% 98% 94%  Weight:        General: 87 y.o. frail appearing female resting in bed in NAD ENMT: Nares patent w/o discharge, orophaynx clear, dentition normal, ears w/o discharge/lesions/ulcers Neck: Supple, trachea midline Cardiovascular: RRR, +S1, S2, no m/g/r, equal pulses throughout Respiratory: CTABL, no w/r/r, normal WOB GI: BS+, NDNT, no masses noted, no organomegaly noted MSK: No e/c/c Neuro: alert to name, following some commands   The results of significant diagnostics from this hospitalization (including imaging, microbiology, ancillary and laboratory) are listed below for reference.     Microbiology: Recent Results (from the past 240 hour(s))  Resp Panel by RT-PCR (Flu A&B, Covid) Nasopharyngeal Swab     Status: Abnormal   Collection Time: 07/26/20  8:32 PM   Specimen: Nasopharyngeal Swab; Nasopharyngeal(NP) swabs in vial transport medium  Result Value Ref Range Status   SARS Coronavirus 2 by RT PCR POSITIVE (A) NEGATIVE Final    Comment: RESULT CALLED TO, READ BACK BY AND VERIFIED WITH: LISA ADKINS RN AT 2149 ON 07/26/20 BY I.SUGUT (NOTE) SARS-CoV-2 target nucleic acids are DETECTED.  The SARS-CoV-2 RNA is generally detectable in upper respiratory specimens during the acute phase of infection. Positive results are indicative of the presence of the identified virus, but do not rule out bacterial infection or co-infection with other pathogens not detected by the test. Clinical correlation with patient history and other diagnostic information is necessary to determine patient infection status. The expected result is Negative.  Fact Sheet for Patients: BloggerCourse.comhttps://www.fda.gov/media/152166/download  Fact Sheet for Healthcare Providers: SeriousBroker.ithttps://www.fda.gov/media/152162/download  This test is not yet approved or cleared by the [place] FDA and  has been authorized for detection and/or diagnosis of SARS-CoV-2 by FDA  under an Emergency Use Authorization (EUA).  This EUA will remain in effect (meaning this  test can be used) for the duration of  the COVID-19 declaration under Section 564(b)(1) of the Act, 21 U.S.C. section 360bbb-3(b)(1), unless the authorization is terminated or revoked sooner.     Influenza A by PCR NEGATIVE NEGATIVE Final   Influenza B by PCR NEGATIVE NEGATIVE Final    Comment: (NOTE) The Xpert Xpress SARS-CoV-2/FLU/RSV plus assay is intended as an aid in the diagnosis of influenza from Nasopharyngeal swab specimens and should not be used as a sole basis for treatment. Nasal washings and aspirates are unacceptable for Xpert Xpress SARS-CoV-2/FLU/RSV testing.  Fact Sheet for Patients: BloggerCourse.comhttps://www.fda.gov/media/152166/download  Fact Sheet for Healthcare Providers: SeriousBroker.ithttps://www.fda.gov/media/152162/download  This test is not yet approved or cleared by the [place] FDA and has been authorized for detection and/or diagnosis of SARS-CoV-2 by FDA under an Emergency Use Authorization (EUA). This EUA will remain in effect (meaning this test can be used) for the duration of the COVID-19 declaration under Section 564(b)(1) of the Act, 21 U.S.C. section 360bbb-3(b)(1), unless the authorization is terminated or revoked.  Performed at [facility] [place], [address] [place], [place] [address]   Blood Culture (routine x 2)     Status: None   Collection Time: 07/26/20  8:59 PM   Specimen: Left Antecubital; Blood  Result Value Ref Range Status   Specimen Description   Final    LEFT ANTECUBITAL Performed at [facility] [place], [address] [place], [place] [address]    Special Requests   Final    BOTTLES DRAWN AEROBIC AND ANAEROBIC Blood Culture adequate volume Performed at [facility], [address] [place], [place] [address]    Culture   Final    NO GROWTH 5 DAYS Performed at [facility], [address] [place], [place] [address]     Report Status 08/01/2020 FINAL  Final  Blood Culture (routine x 2)     Status: None   Collection Time: 07/26/20  9:10 PM   Specimen: Right Antecubital; Blood  Result Value Ref Range Status   Specimen Description   Final    RIGHT ANTECUBITAL Performed at [facility], [address] [place], [place] [address]    Special Requests   Final    BOTTLES DRAWN AEROBIC AND ANAEROBIC Blood Culture results may not be optimal due to an inadequate volume of blood received in culture bottles Performed at [facility], [address] [place], [place] [address]    Culture   Final    NO GROWTH 5 DAYS Performed at [facility], [address] [place], [place] [address]    Report Status 08/01/2020 FINAL  Final     Labs: BNP (last 3 results) No results for input(s): BNP in the last 8760 hours. Basic Metabolic Panel: Recent Labs  Lab 07/28/20 0616 07/29/20 0705 07/30/20 0433 07/31/20 0456 08/01/20 0336 08/02/20 0422 08/03/20 0748  NA 138 134* 137 139 139 140  --   K 3.8 3.9 4.2 4.2 4.6 5.1  --   CL 102 100 100 103 104 106  --   CO2 23 23 26 26 28 27   --   GLUCOSE 146* 180* 155* 200* 175* 166*  --   BUN 32* 56* 54* 64* 65* 55*  --   CREATININE 1.10* 1.60* 1.34* 1.28* 1.23* 0.97 0.93  CALCIUM 8.2* 7.8* 7.8* 7.8* 8.1* 8.0*  --   MG 2.1 2.3 2.5* 2.7*  --  2.6*  --   PHOS 3.7 3.8 4.3 2.9  --   --   --    Liver Function Tests: Recent Labs  Lab 07/29/20 0705 07/30/20 0433 07/31/20 0456 08/01/20 0336 08/02/20 0422  AST 40 44* 38 33 34  ALT 27 28 25 25 22   ALKPHOS 76 86 77 77 69  BILITOT 0.8 0.9 0.6 0.9 0.8  PROT 4.9* 5.1* 4.8* 5.0* 5.1*  ALBUMIN 2.1* 2.3* 2.2* 2.2* 2.2*   No results for input(s): LIPASE, AMYLASE in the last 168 hours. No results for input(s): AMMONIA in the last 168 hours. CBC: Recent Labs  Lab 07/29/20 0705 07/30/20 0433 07/31/20 0456 08/01/20 0336 08/02/20 0422  WBC 15.9* 21.4* 16.4* 15.9* 13.6*  NEUTROABS 13.7* 20.4* 14.8*  14.3* 12.1*  HGB 11.0* 11.7* 10.3* 10.2* 9.8*  HCT 32.0* 35.3* 31.0* 31.1* 31.8*  MCV 89.4 91.5 91.4 91.7 96.4  PLT 247 255 231 221 252   Cardiac Enzymes: No results for input(s): CKTOTAL, CKMB, CKMBINDEX, TROPONINI in the last 168 hours. BNP: Invalid input(s): POCBNP CBG: Recent Labs  Lab 07/27/20 1835  GLUCAP 139*   D-Dimer Recent Labs    08/01/20 0336 08/02/20 0422  DDIMER 3.08* 2.89*   Hgb A1c No results for input(s): HGBA1C in the last 72 hours. Lipid Profile No results for input(s): CHOL, HDL, LDLCALC, TRIG, CHOLHDL, LDLDIRECT in the last 72 hours. Thyroid function studies No results for input(s):  TSH, T4TOTAL, T3FREE, THYROIDAB in the last 72 hours.  Invalid input(s): FREET3 Anemia work up Recent Labs    08/02/20 0422  FERRITIN 781*   Urinalysis    Component Value Date/Time   COLORURINE BROWN (A) 07/27/2020 0924   APPEARANCEUR CLOUDY (A) 07/27/2020 0924   LABSPEC 1.030 07/27/2020 0924   PHURINE 5.0 07/27/2020 0924   GLUCOSEU NEGATIVE 07/27/2020 0924   HGBUR NEGATIVE 07/27/2020 0924   BILIRUBINUR LARGE (A) 07/27/2020 0924   KETONESUR NEGATIVE 07/27/2020 0924   PROTEINUR 30 (A) 07/27/2020 0924   NITRITE NEGATIVE 07/27/2020 0924   LEUKOCYTESUR NEGATIVE 07/27/2020 0924   Sepsis Labs Invalid input(s): PROCALCITONIN,  WBC,  LACTICIDVEN Microbiology Recent Results (from the past 240 hour(s))  Resp Panel by RT-PCR (Flu A&B, Covid) Nasopharyngeal Swab     Status: Abnormal   Collection Time: 07/26/20  8:32 PM   Specimen: Nasopharyngeal Swab; Nasopharyngeal(NP) swabs in vial transport medium  Result Value Ref Range Status   SARS Coronavirus 2 by RT PCR POSITIVE (A) NEGATIVE Final    Comment: RESULT CALLED TO, READ BACK BY AND VERIFIED WITH: LISA ADKINS RN AT 2149 ON 07/26/20 BY I.SUGUT (NOTE) SARS-CoV-2 target nucleic acids are DETECTED.  The SARS-CoV-2 RNA is generally detectable in upper respiratory specimens during the acute phase of infection.  Positive results are indicative of the presence of the identified virus, but do not rule out bacterial infection or co-infection with other pathogens not detected by the test. Clinical correlation with patient history and other diagnostic information is necessary to determine patient infection status. The expected result is Negative.  Fact Sheet for Patients: BloggerCourse.com  Fact Sheet for Healthcare Providers: SeriousBroker.it  This test is not yet approved or cleared by the [place] FDA and  has been authorized for detection and/or diagnosis of SARS-CoV-2 by FDA under an Emergency Use Authorization (EUA).  This EUA will remain in effect (meaning this  test can be used) for the duration of  the COVID-19 declaration under Section 564(b)(1) of the Act, 21 U.S.C. section 360bbb-3(b)(1), unless the authorization is terminated or revoked sooner.     Influenza A by PCR NEGATIVE NEGATIVE Final   Influenza B by PCR NEGATIVE NEGATIVE Final    Comment: (NOTE) The Xpert Xpress SARS-CoV-2/FLU/RSV plus assay is intended as an aid in the diagnosis of influenza from Nasopharyngeal swab specimens and should not be used as a sole basis for treatment. Nasal washings and aspirates are unacceptable for Xpert Xpress SARS-CoV-2/FLU/RSV testing.  Fact Sheet for Patients: BloggerCourse.com  Fact Sheet for Healthcare Providers: SeriousBroker.it  This test is not yet approved or cleared by the [place] FDA and has been authorized for detection and/or diagnosis of SARS-CoV-2 by FDA under an Emergency Use Authorization (EUA). This EUA will remain in effect (meaning this test can be used) for the duration of the COVID-19 declaration under Section 564(b)(1) of the Act, 21 U.S.C. section 360bbb-3(b)(1), unless the authorization is terminated or revoked.  Performed at [facility],  [address] [place], [place] [address]   Blood Culture (routine x 2)     Status: None   Collection Time: 07/26/20  8:59 PM   Specimen: Left Antecubital; Blood  Result Value Ref Range Status   Specimen Description   Final    LEFT ANTECUBITAL Performed at [facility], [address] [place], [place] [address]    Special Requests   Final    BOTTLES DRAWN AEROBIC AND ANAEROBIC Blood Culture  adequate volume Performed at [facility], [address] [place], [place] [address]    Culture   Final    NO GROWTH 5 DAYS Performed at [facility], [address], [place], [place] [address]    Report Status 08/01/2020 FINAL  Final  Blood Culture (routine x 2)     Status: None   Collection Time: 07/26/20  9:10 PM   Specimen: Right Antecubital; Blood  Result Value Ref Range Status   Specimen Description   Final    RIGHT ANTECUBITAL Performed at [facility], [address] [place], [place] [address]    Special Requests   Final    BOTTLES DRAWN AEROBIC AND ANAEROBIC Blood Culture results may not be optimal due to an inadequate volume of blood received in culture bottles Performed at [facility], [address] [place], [place] [address]    Culture   Final    NO GROWTH 5 DAYS Performed at [facility], [address] [place], [place] [address]    Report Status 08/01/2020 FINAL  Final     Time coordinating discharge: 35 minutes  SIGNED:   Teddy Spike, DO  [facility] 08/03/2020, 10:44 AM   If 7PM-7AM, please contact night-coverage [email or web address]

## 2020-08-03 NOTE — Care Management Important Message (Signed)
Important Message  Patient Details  Name: Teresa Ward MRN: [id number] Date of Birth: [birth year]   Medicare Important Message Given:  Yes - Important Message mailed due to current National Emergency  Verbal consent obtained due to current National Emergency  Relationship to patient: Self Contact Name: Rozlynn Lippold Call Date: 08/03/20  Time: 1514 Phone: [phone] Outcome: No Answer/Busy Important Message mailed to: Patient address on file    Orson Aloe 08/03/2020, 3:14 PM

## 2020-08-04 NOTE — Progress Notes (Signed)
Patient daughter called to see what the ordered dose for valsartan was. Advised dose was 160mg .

## 2021-02-17 IMAGING — DX RIGHT WRIST - COMPLETE 3+ VIEW
4 series · 4 of 4 positions shown · non-contrast
Comparison: None.

CLINICAL DATA: 88-year-old female with fall on outstretched hand

EXAM:
RIGHT WRIST - COMPLETE 3+ VIEW

[wrist pa]
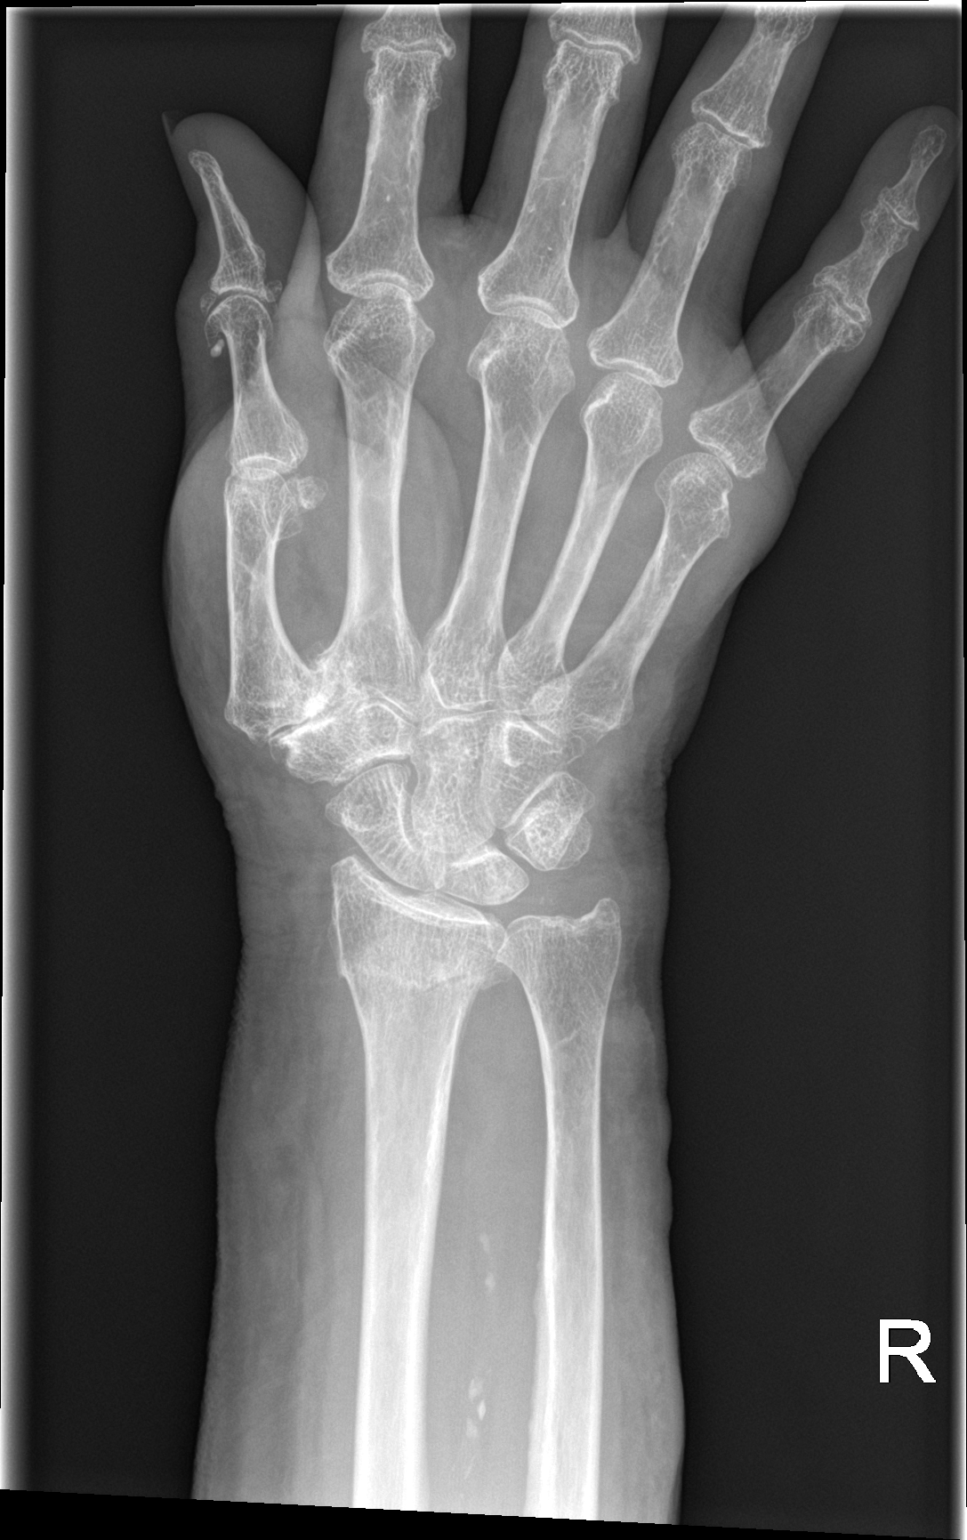

[wrist obl]
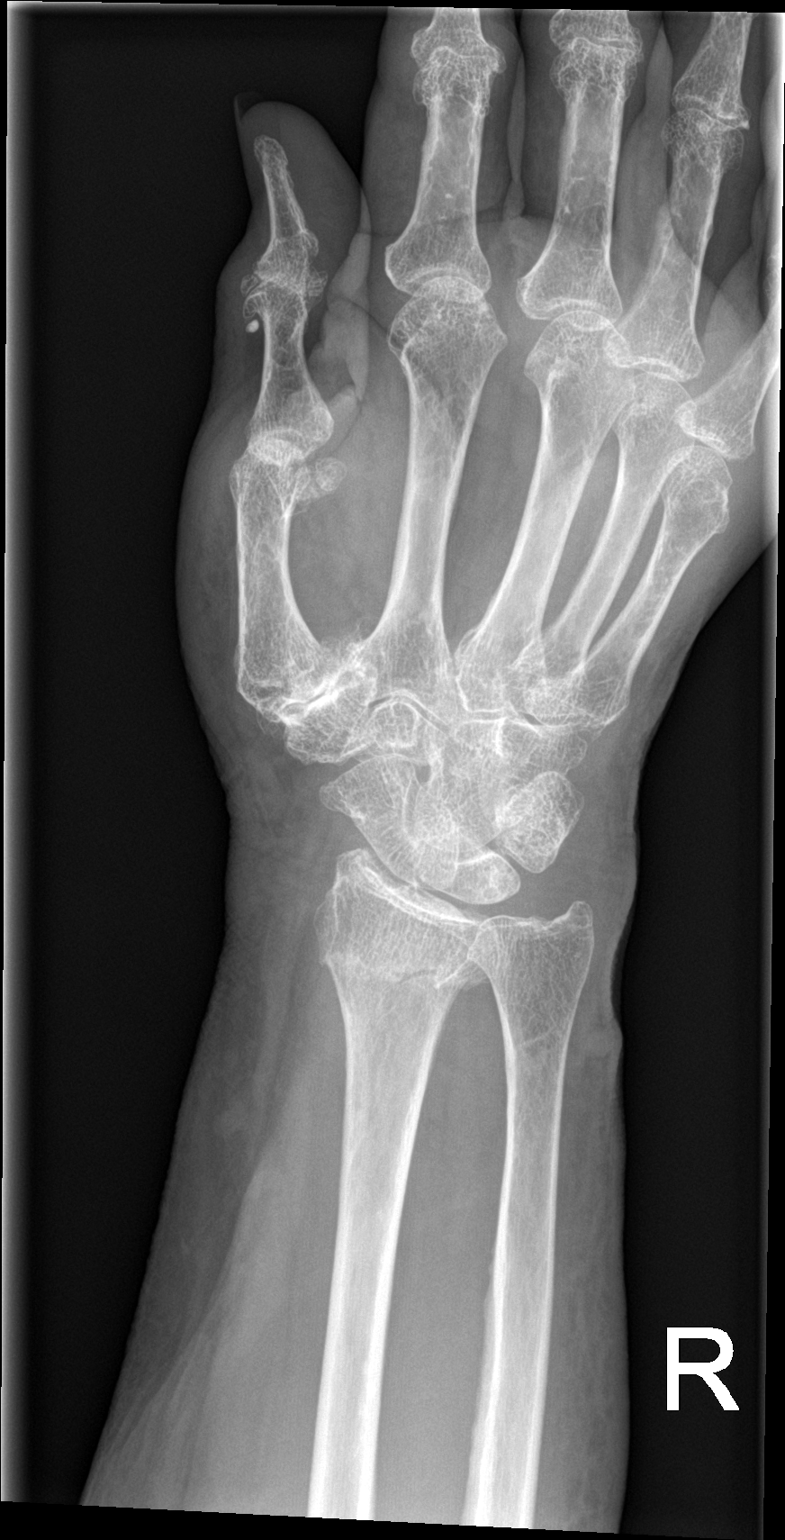

[wrist lat]
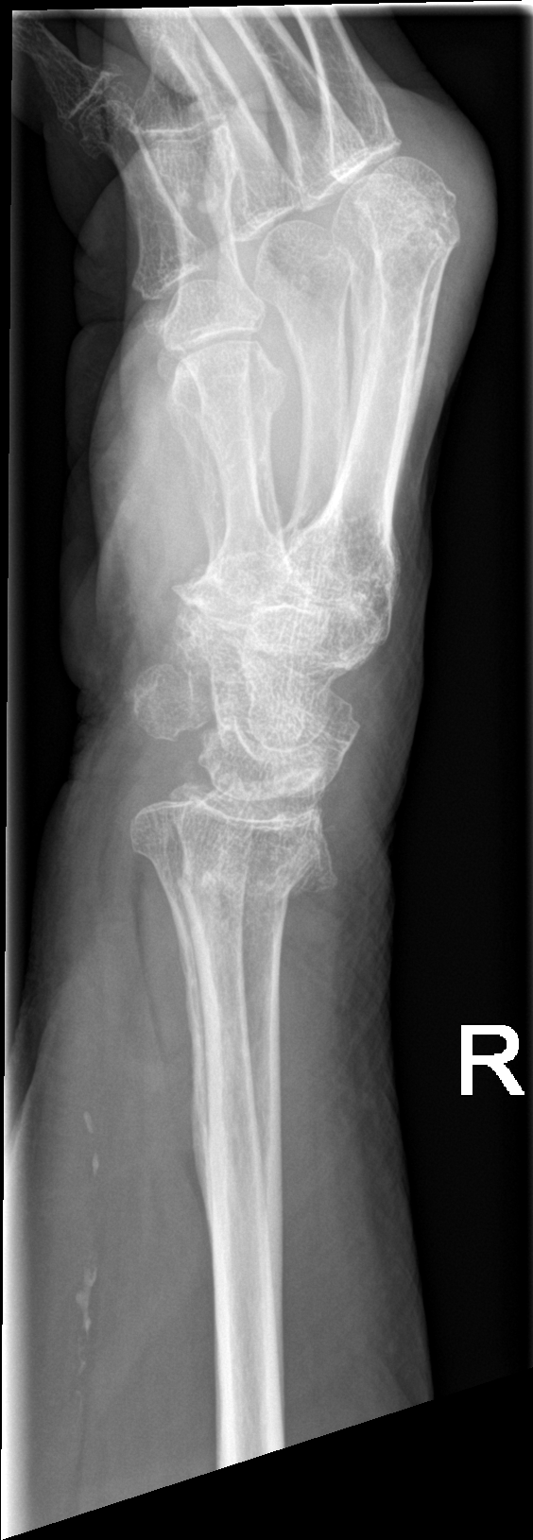

[wrist navicular]
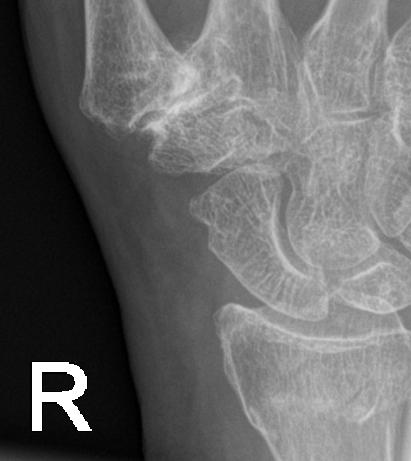

[4 of 4 positions shown; findings below may reference images not displayed]

FINDINGS: Fracture of the distal radial metadiaphysis with slight volar
angulation measuring 10 degrees or less. Associated soft tissue
swelling.

No distal ulnar fracture identified.

No carpal fracture identified, however, osteopenia somewhat limits
evaluation. Degenerative changes of the carpometacarpal joints, most
pronounced at the first.
IMPRESSION: Acute fracture of the distal radial metadiaphysis with slight volar
angulation.

Osteopenia.

## 2021-08-10 DEATH — deceased

## 2022-11-07 IMAGING — DX DG CHEST 1V PORT
1 series · 1 of 1 positions shown · non-contrast
Comparison: Radiograph 04/02/2020

CLINICAL DATA: Cough, fever for 2 days

EXAM:
PORTABLE CHEST 1 VIEW

[chest ap]
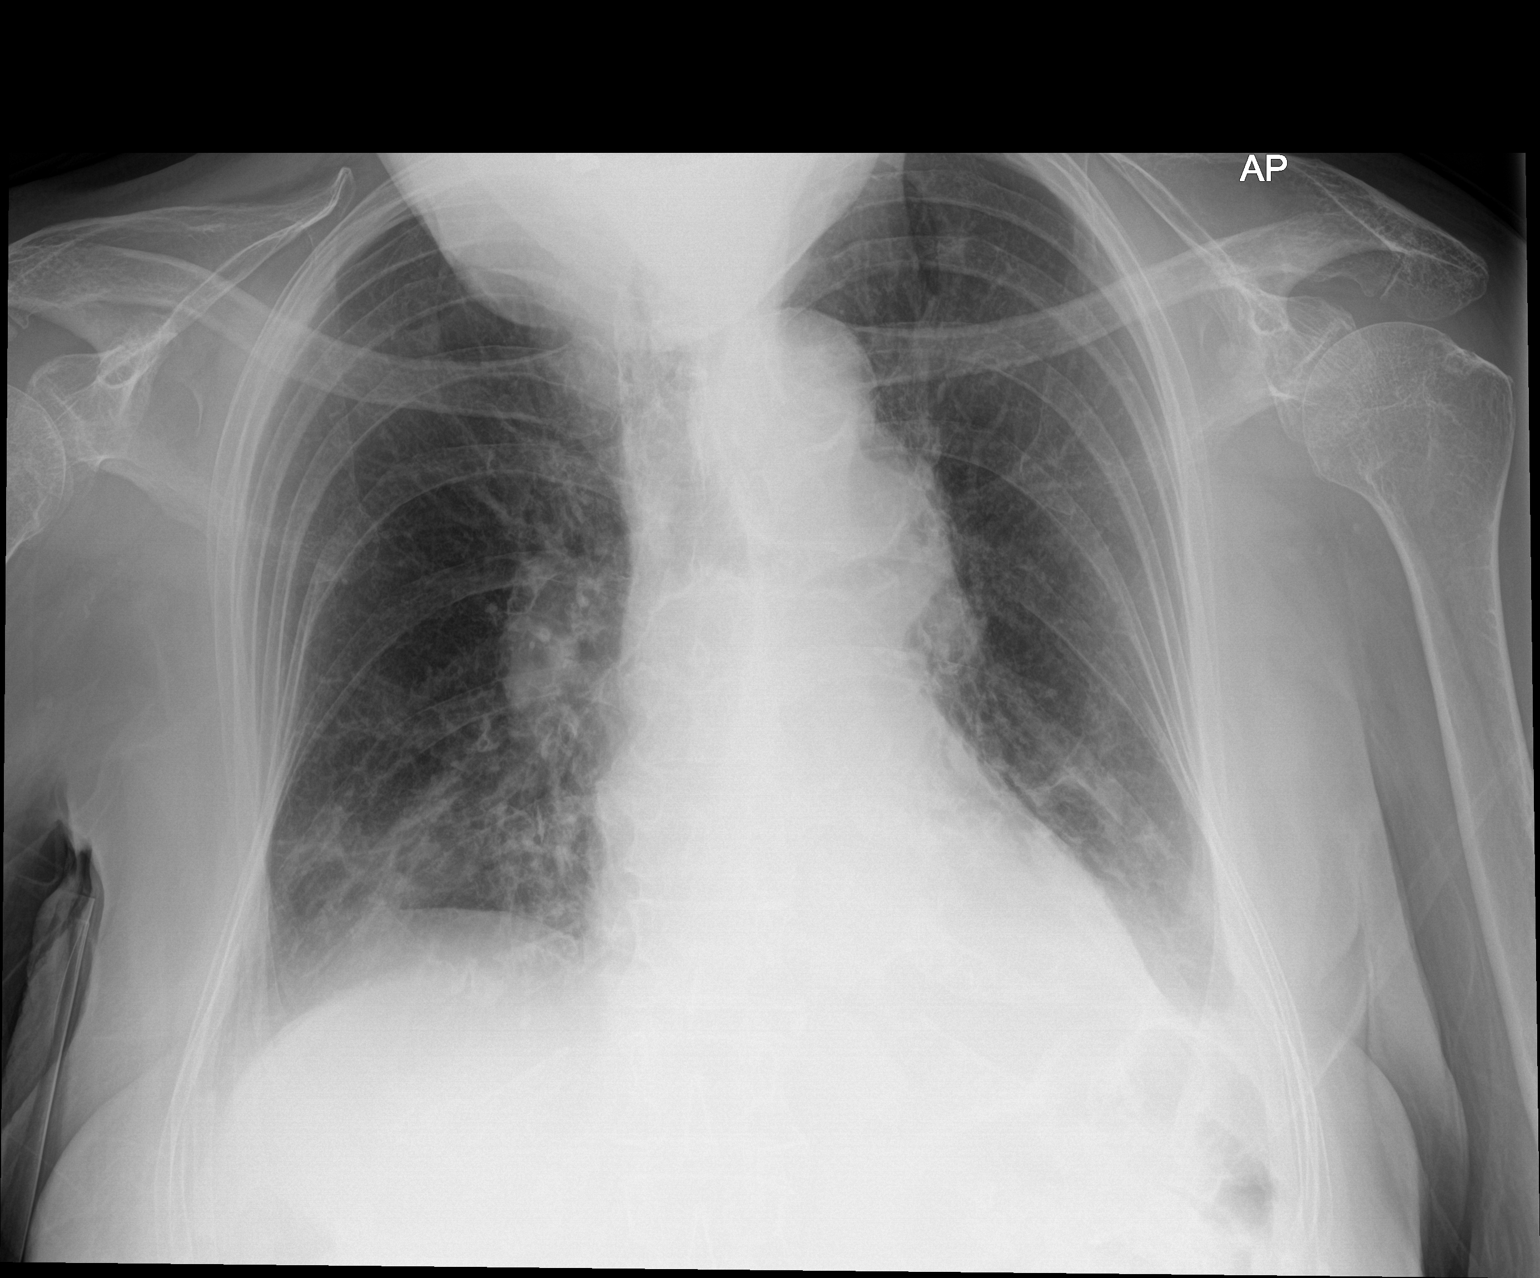

[1 of 1 positions shown; findings below may reference images not displayed]

FINDINGS: Some increasing interstitial and patchy opacities towards the lung
bases with diffuse airways thickening. No pneumothorax. Trace right
and small left pleural effusion lateral pleural thickening. The
aorta is calcified. The remaining cardiomediastinal contours are
unremarkable. No acute osseous or soft tissue abnormality.
Degenerative changes are present in the imaged spine and shoulders.
IMPRESSION: 1. Increasing interstitial and patchy opacities towards the lung
bases with diffuse airways thickening, compatible with multifocal
pneumonia in the setting of fever.
2. Trace right and small left pleural effusions.
